# Patient Record
Sex: Female | Born: 1980 | Race: White | Hispanic: No | Marital: Single
Health system: Midwestern US, Academic
[De-identification: ages and names within clinical notes are randomized; demographics above are authoritative.]

---

## 2019-09-20 ENCOUNTER — Encounter: Admit: 2019-09-20 | Discharge: 2019-09-20

## 2020-02-16 IMAGING — US ABDLM
1 series · 14 of 25 positions shown · non-contrast
Comparison: none

[Series 1: us abdomen limited · 14 of 38 slices shown]
[im 1/38]
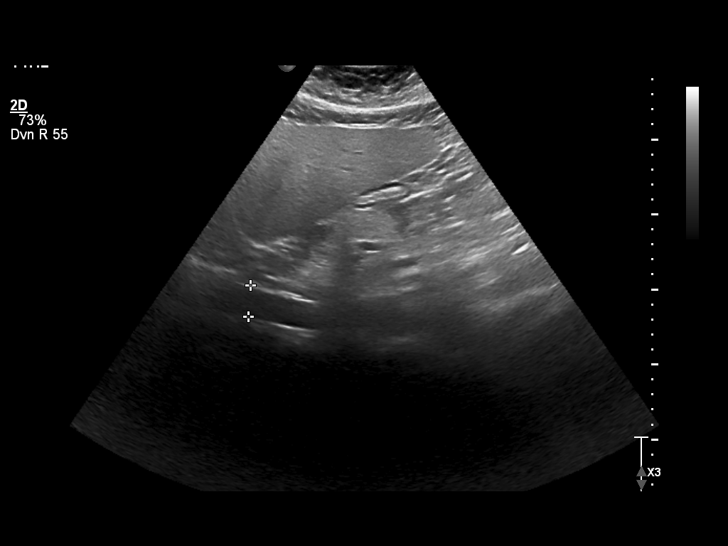
[im 4/38]
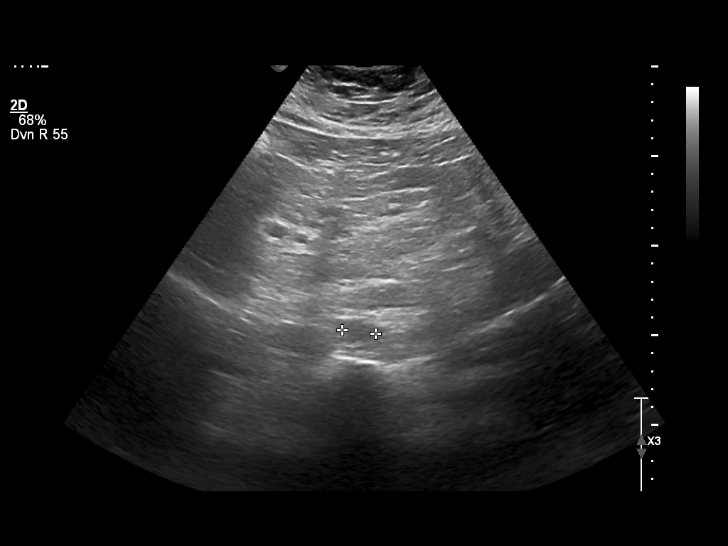
[im 7/38]
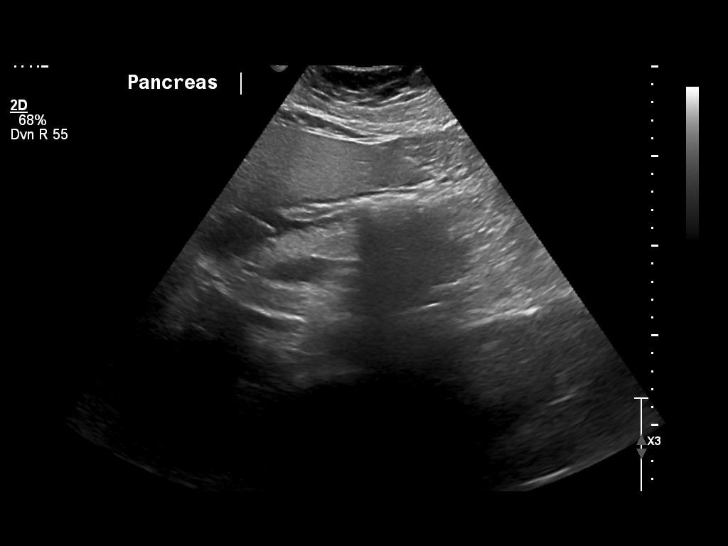
[im 10/38]
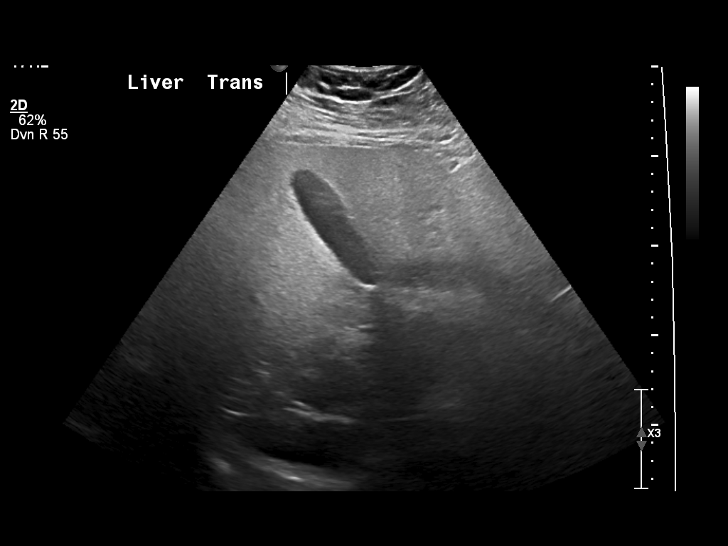
[im 13/38]
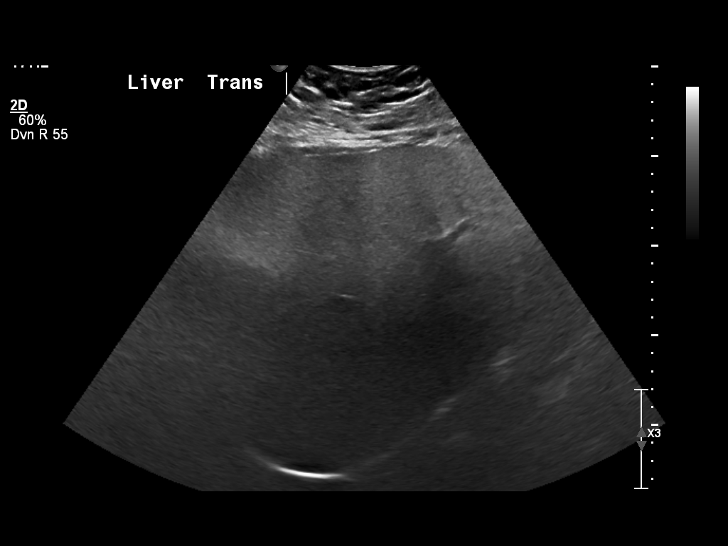
[im 14/38]
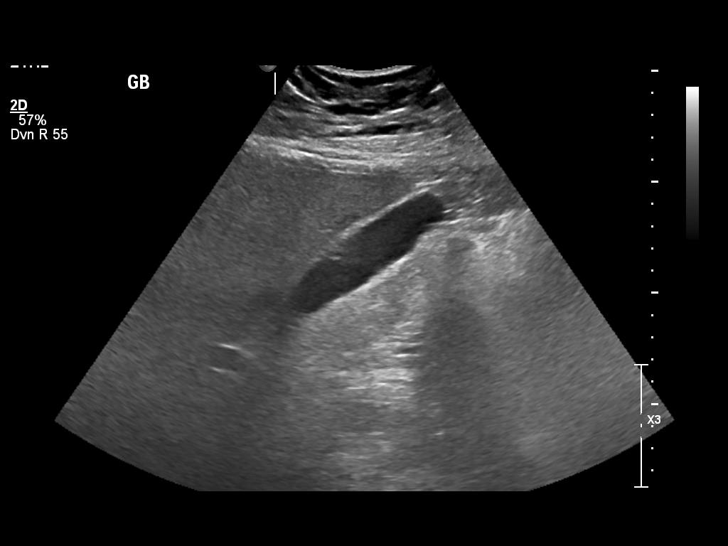
[im 17/38]
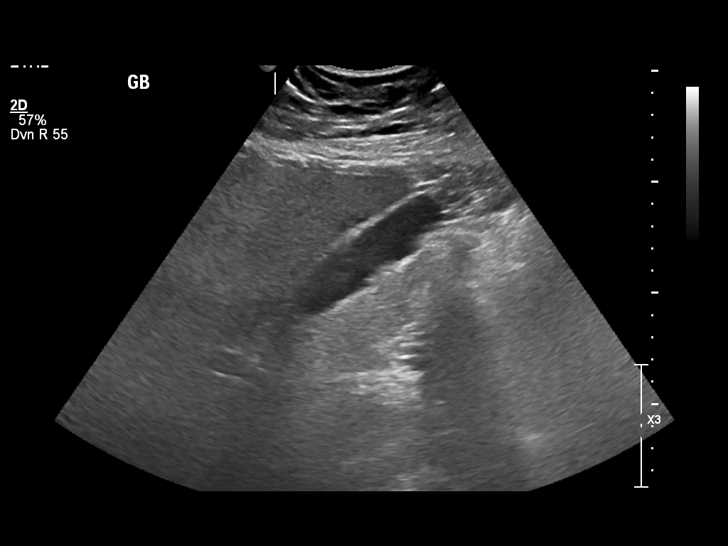
[im 21/38]
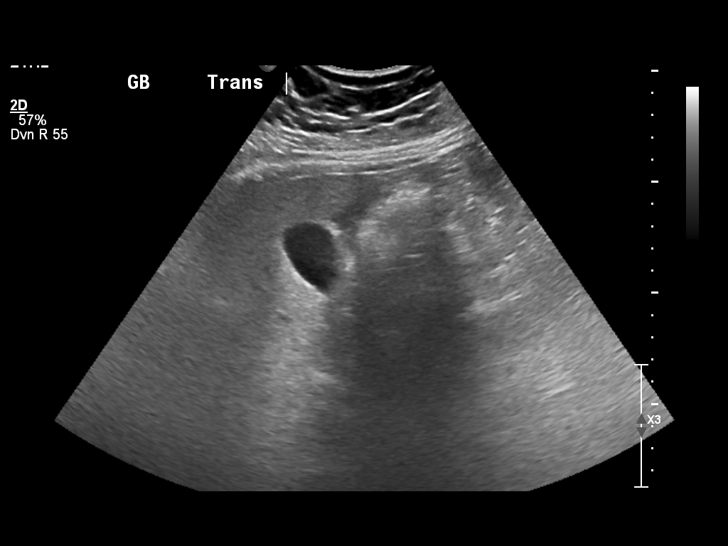
[im 24/38]
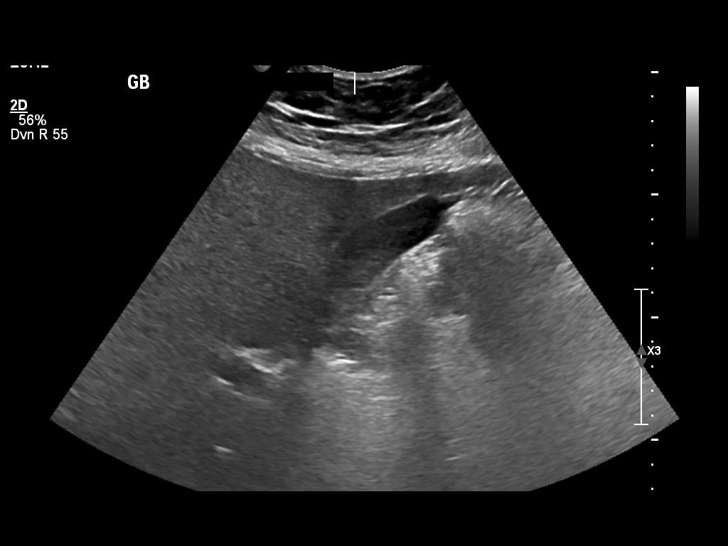
[im 25/38]
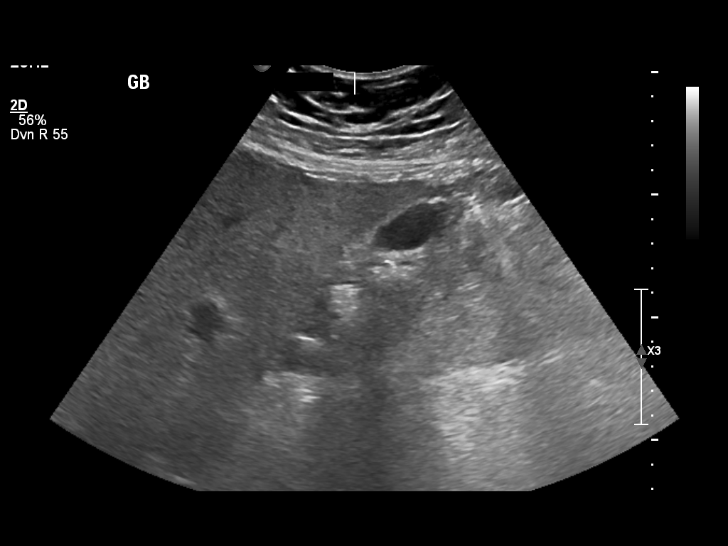
[im 28/38]
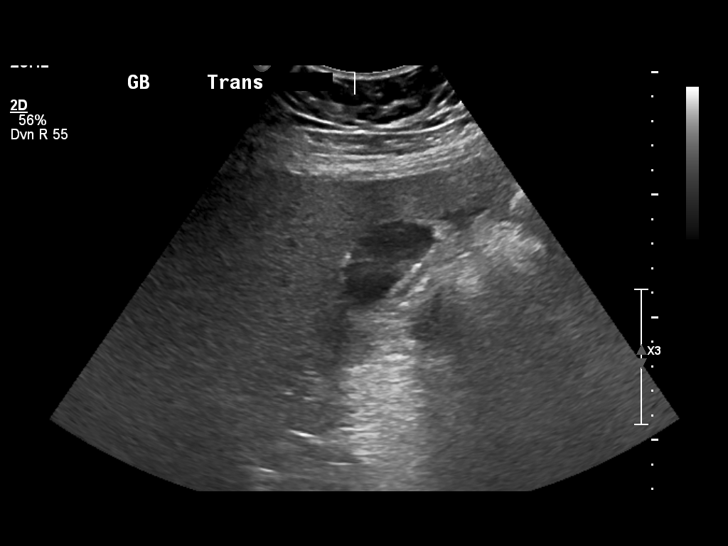
[im 31/38]
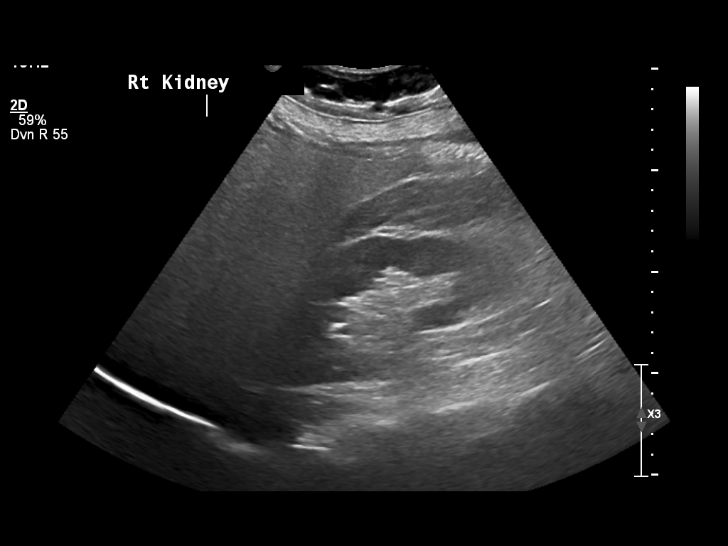
[im 34/38]
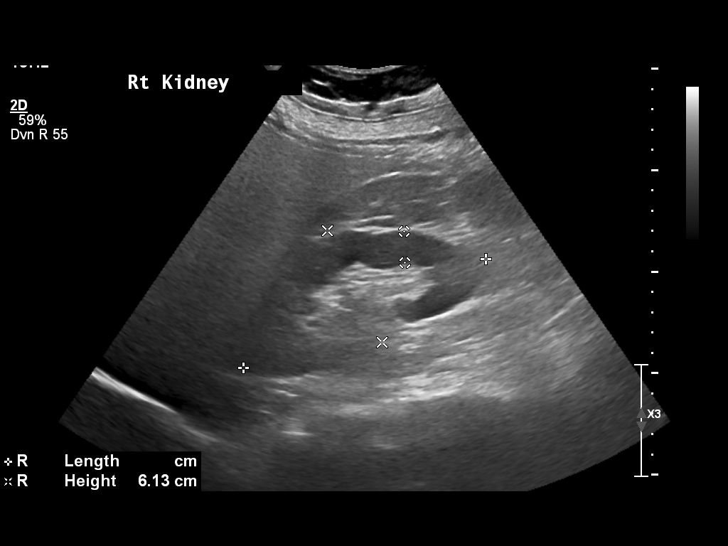
[im 38/38]
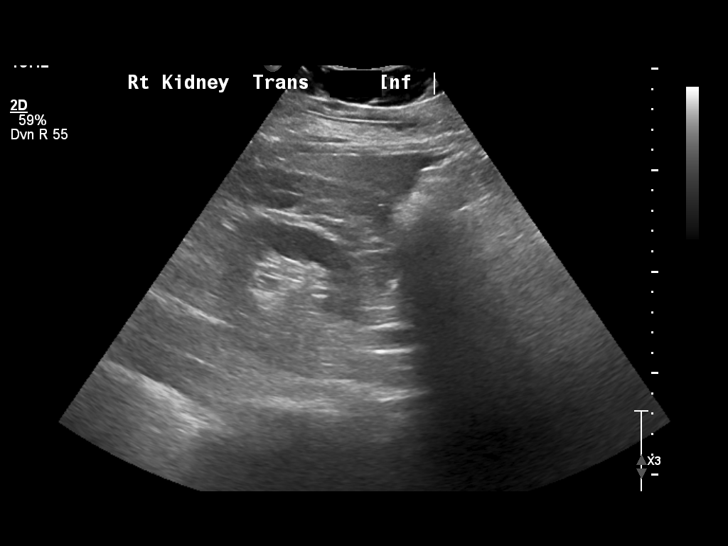

[14 of 25 positions shown; findings below may reference images not displayed]

EXAM

Ultrasound abdomen limited right upper quadrant

INDICATION

Postprandial nausea

FINDINGS

Ultrasound of the right upper abdominal quadrant demonstrates a normal thin walled gallbladder with
no gallstones and no pericholecystic fluid. The common bile duct diameter is 4.7 millimeters.

The liver is enlarged, measuring 21.1 cm in length. There is mildly increased echogenicity with no
focal abnormality.

The head and portion of the body of the pancreas appear normal. A portion of the pancreatic body
and tail are obscured by bowel gas.

The right kidney measures 13.1 x 6.1 x 7.8 cm in diameter. There is no hydronephrosis. There is no
mass lesion or shadowing calculus.

The abdominal aorta is not enlarged.

IMPRESSION

There are no gallstones in there is no bile duct obstruction. There is mild hepatomegaly and fatty
infiltration of the liver.

Tech Notes:

## 2020-03-09 IMAGING — MR C-spine^Routine
5 series · 43 of 48 positions shown · non-contrast
Comparison: none

[Series 2: T2 · sagittal · 3.0mm · 0.49mm/px · 6 of 15 slices shown]
[im 1/15]
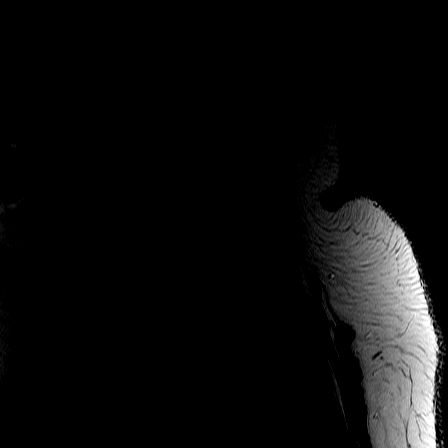
[im 3/15]
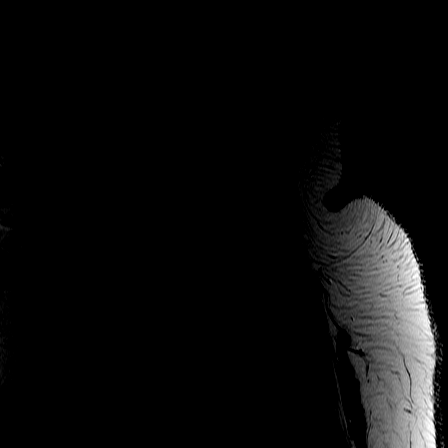
[im 6/15]
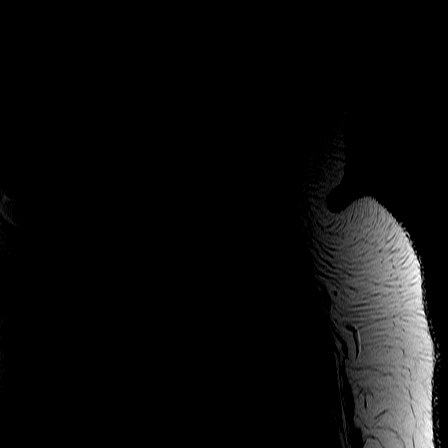
[im 9/15]
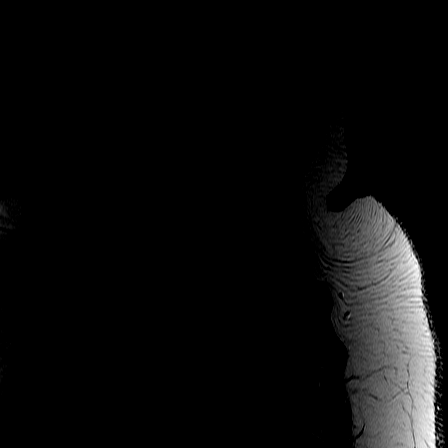
[im 12/15]
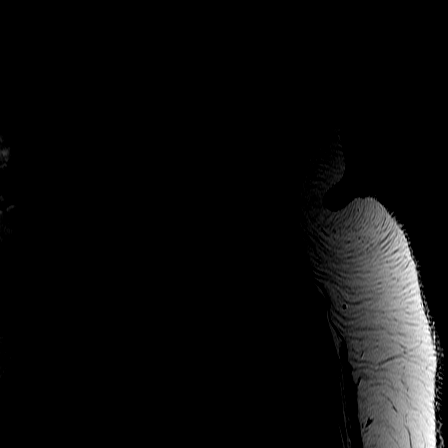
[im 15/15]
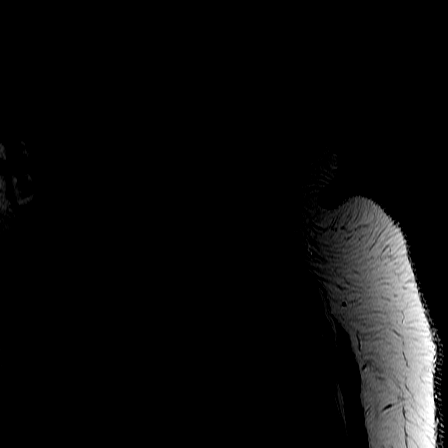

[Series 3: T1 · sagittal · 3.0mm · 0.69mm/px · 7 of 15 slices shown (1 of 2)]
[im 1/15]
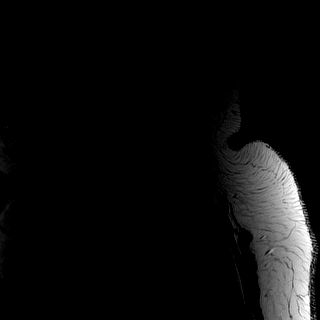
[im 3/15]
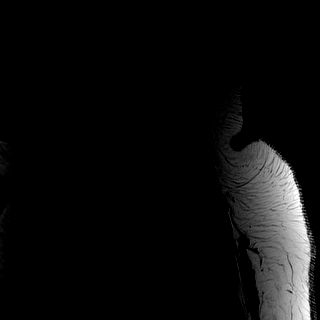
[im 5/15]
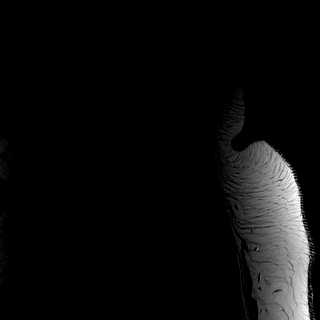
[im 8/15]
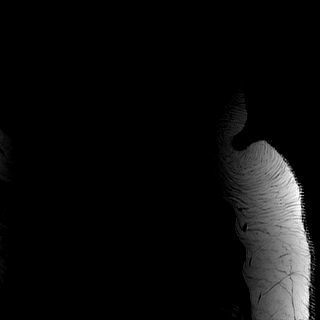
[im 10/15]
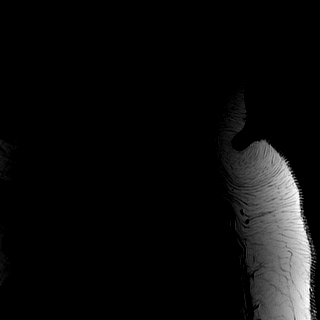
[im 12/15]
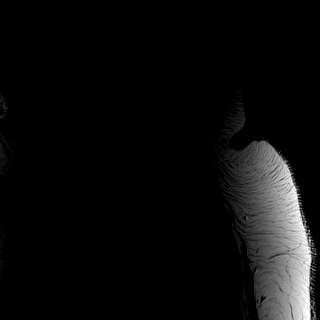
[im 15/15]
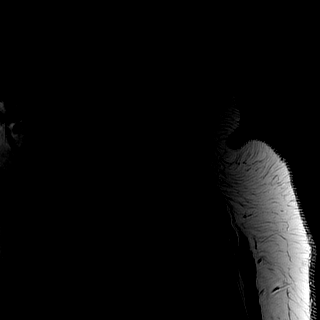

[Series 4: STIR · sagittal · 3.0mm · 0.86mm/px · 7 of 15 slices shown]
[im 1/15]
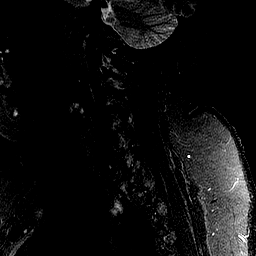
[im 3/15]
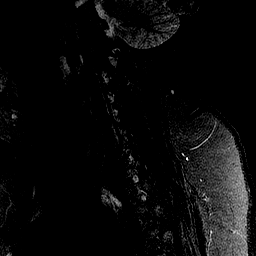
[im 5/15]
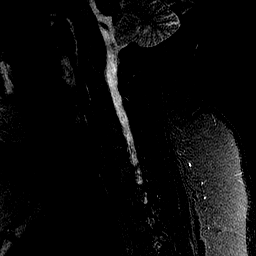
[im 8/15]
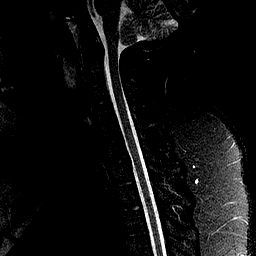
[im 10/15]
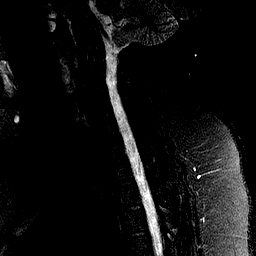
[im 12/15]
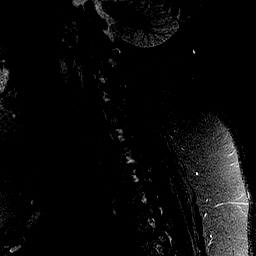
[im 15/15]
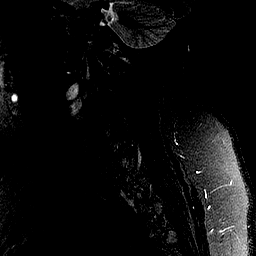

[Series 5: provider echo axial · axial · 3.0mm · 0.39mm/px · z∈[-41,+73]mm · 9 of 30 slices shown]
[im 1/30]
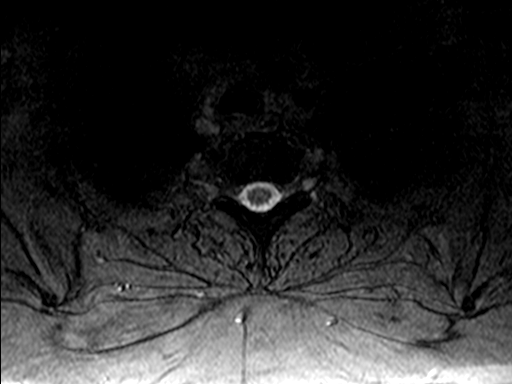
[im 3/30]
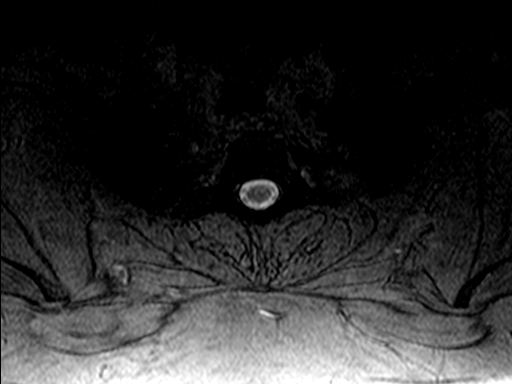
[im 5/30]
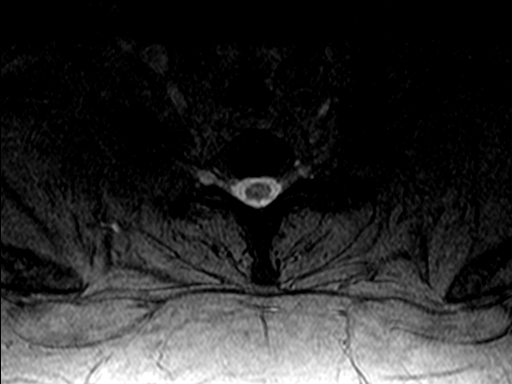
[im 9/30]
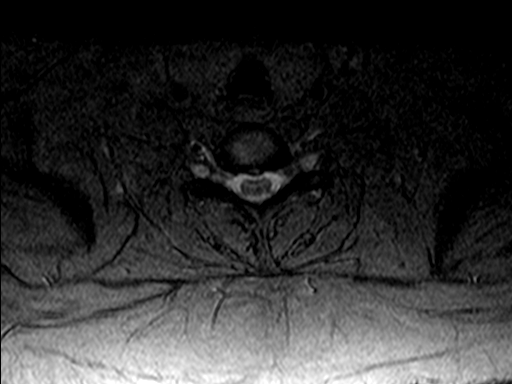
[im 14/30]
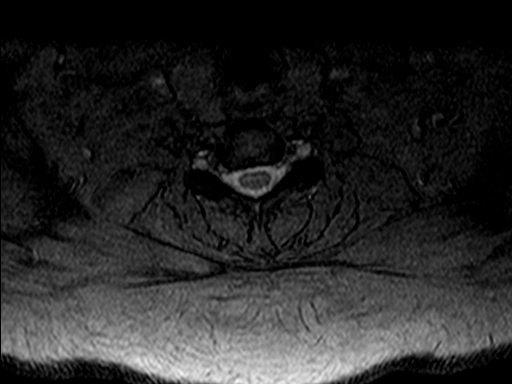
[im 16/30]
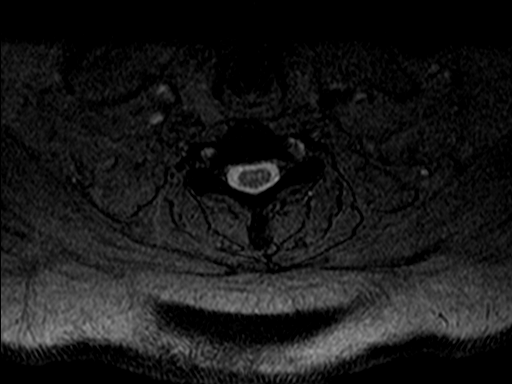
[im 21/30]
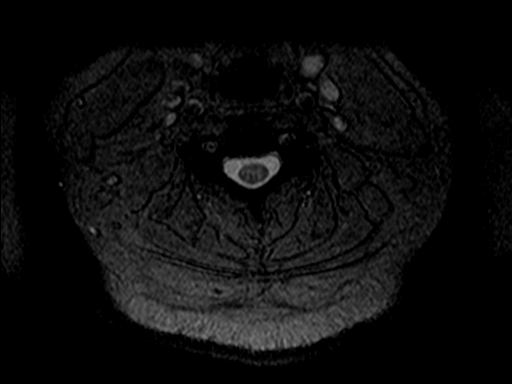
[im 25/30]
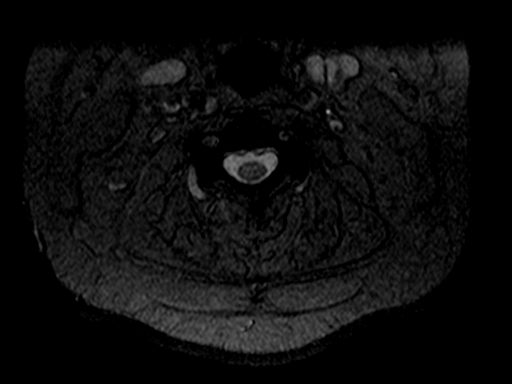
[im 30/30]
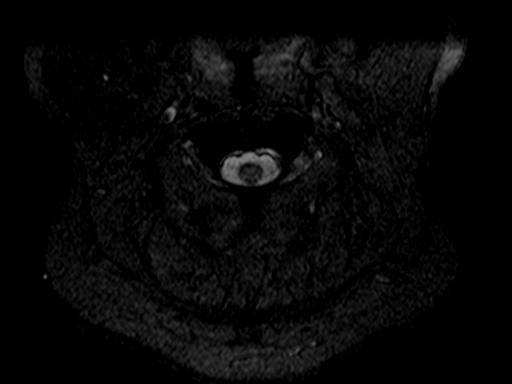

[Series 6: T1 · axial · 3.0mm · 0.78mm/px · z∈[-41,+73]mm · 14 of 30 slices shown (2 of 2)]
[im 1/30]
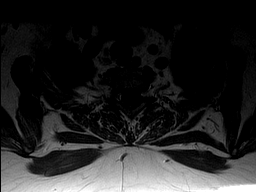
[im 3/30]
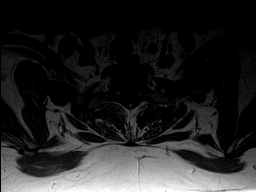
[im 5/30]
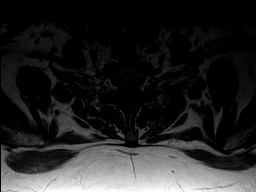
[im 7/30]
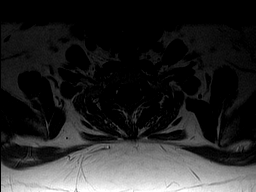
[im 9/30]
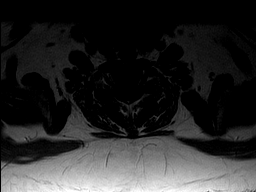
[im 12/30]
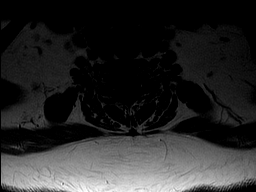
[im 14/30]
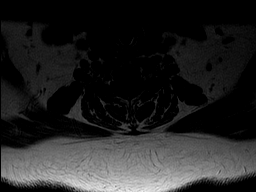
[im 16/30]
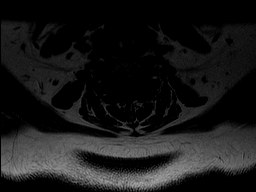
[im 18/30]
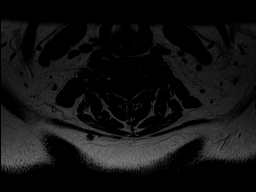
[im 21/30]
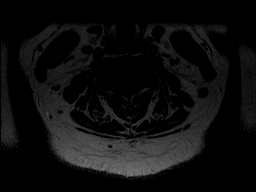
[im 23/30]
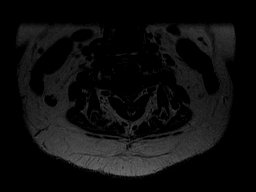
[im 25/30]
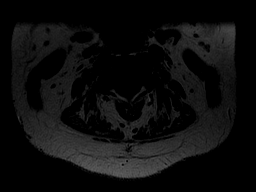
[im 27/30]
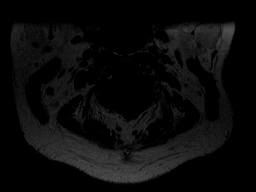
[im 30/30]
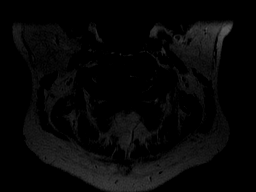

[43 of 48 positions shown; findings below may reference images not displayed]

EXAM

MR cervical spine wo con

INDICATION

Postprandial nausea, left arm/hand pain, numbness

TECHNIQUE

Multiplanar, multisequence imaging of the cervical spine without contrast.

COMPARISONS

None available at the time of dictation.

FINDINGS

ANATOMY: Straightening of the normal cervical lordosis. No spondylolisthesis.

VERTEBRAL BODIES: No endplate compression fracture. No significant endplate degenerative change.

SPINAL CANAL: No significant spinal canal narrowing.

INTERVERTEBRAL DISCS/UNCOVERTEBRAL JOINTS: Diffuse disc dessication. No significant disc
herniation.

FACETS: No significant facet arthropathy.

C2-C3: Unremarkable.

C3-C4: Unremarkable.

C4-C5: Unremarkable

C5-C6: Unremarkable.

C6-C7: Unremarkable.

C7-T1: Unremarkable.

OTHER: Possible reactive cervical chain lymph nodes in the submandibular station, incompletely
characterized (series 5, image 6).

IMPRESSION
1. Straightening of the normal cervical lordosis, which may be secondary to patient positioning
and/or muscular spasm.
2. Normal cervical cord signal. No foraminal narrowing.

Tech Notes:

left arm/hand pain and numbness, no known recent injury, cervical radiculopathy

## 2020-03-09 IMAGING — CT ABDOMEN_PELVIS W(Adult)
2 of 3 series · 12 of 46 positions shown, 14 images · non-contrast
Comparison: none

[Series 2: abdomen_pelvis ax 3.00 br40 s3 · axial · 0.74mm/px · z∈[+1302,+1746]mm · 9 of 172 slices shown, 11 images]
[im 12/172  soft-tissue]
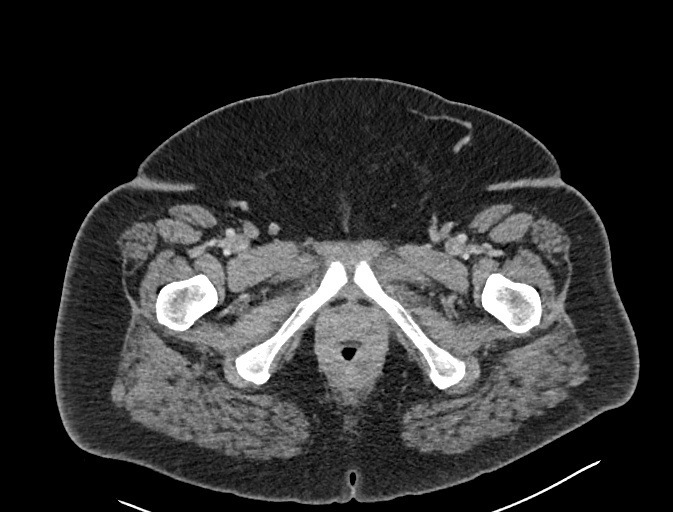
[im 12/172  bone]
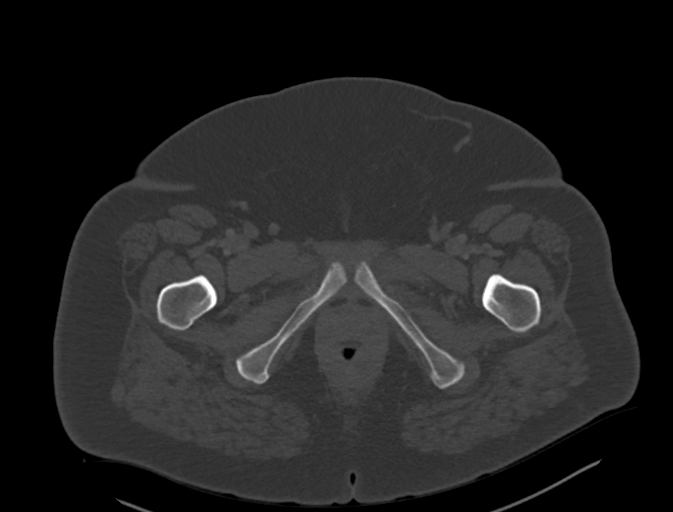
[im 34/172  soft-tissue]
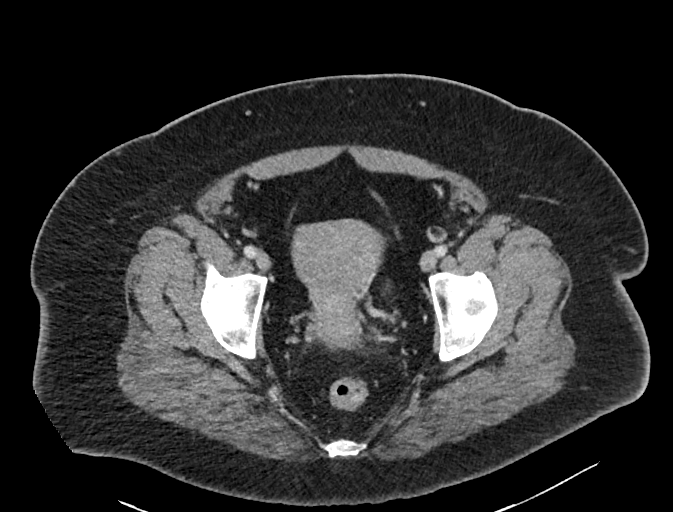
[im 50/172  soft-tissue]
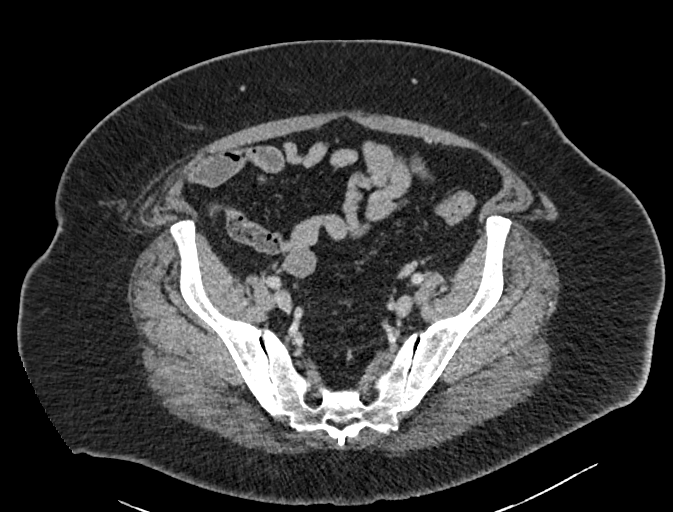
[im 67/172  soft-tissue]
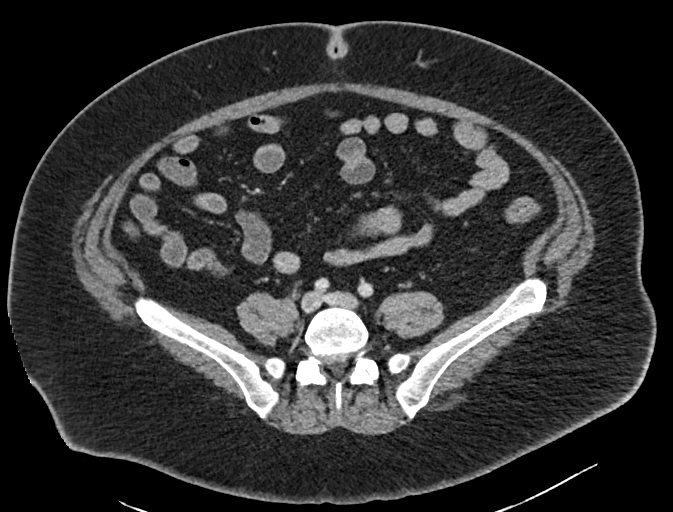
[im 89/172  soft-tissue]
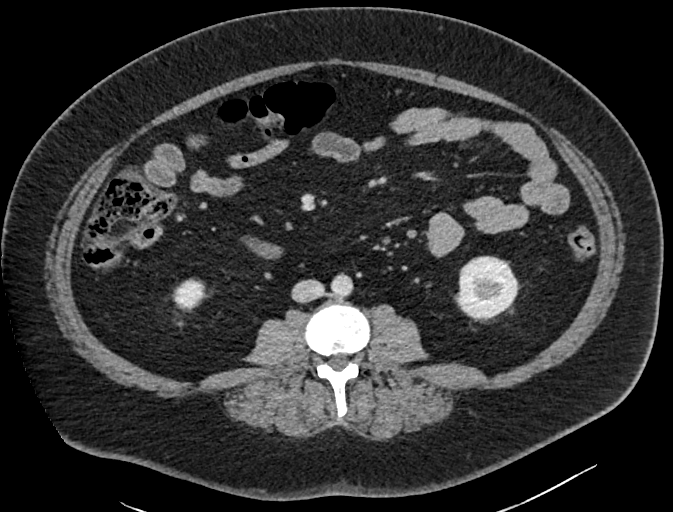
[im 105/172  soft-tissue]
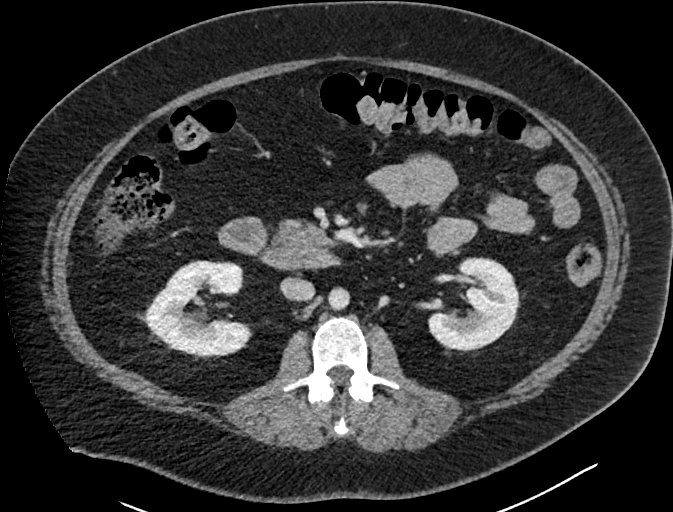
[im 122/172  soft-tissue]
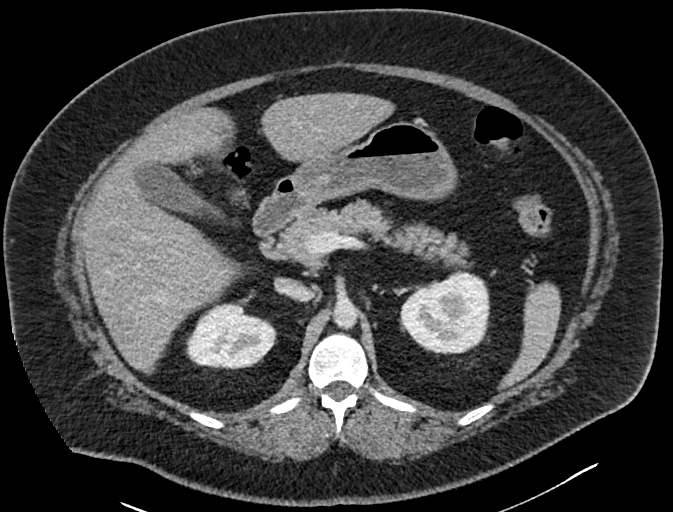
[im 144/172  soft-tissue]
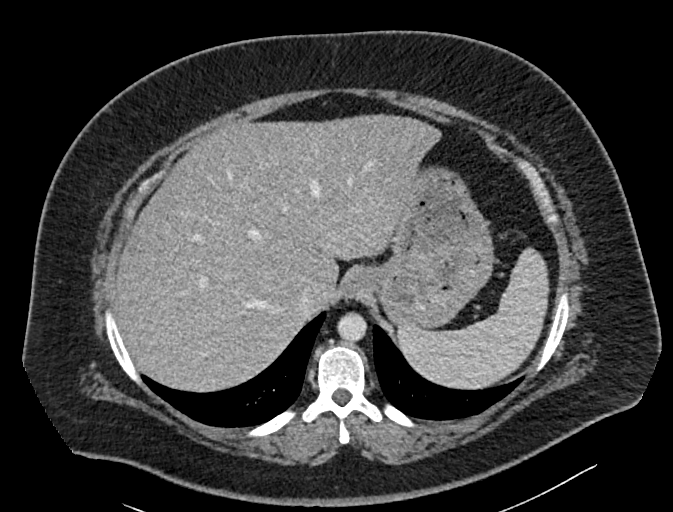
[im 160/172  soft-tissue]
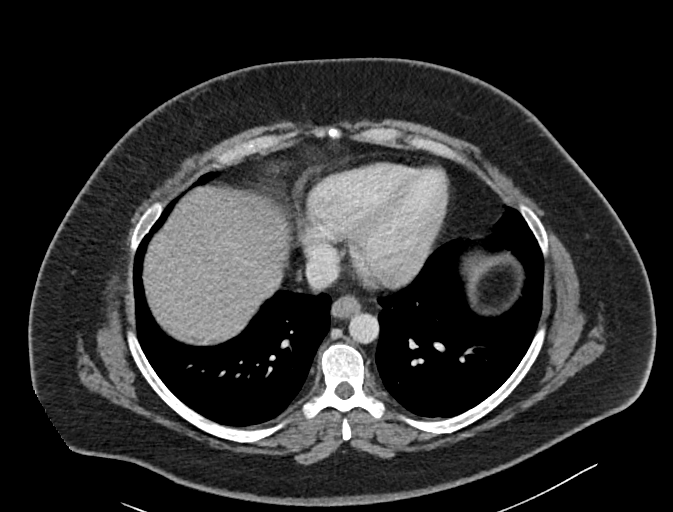
[im 160/172  bone]
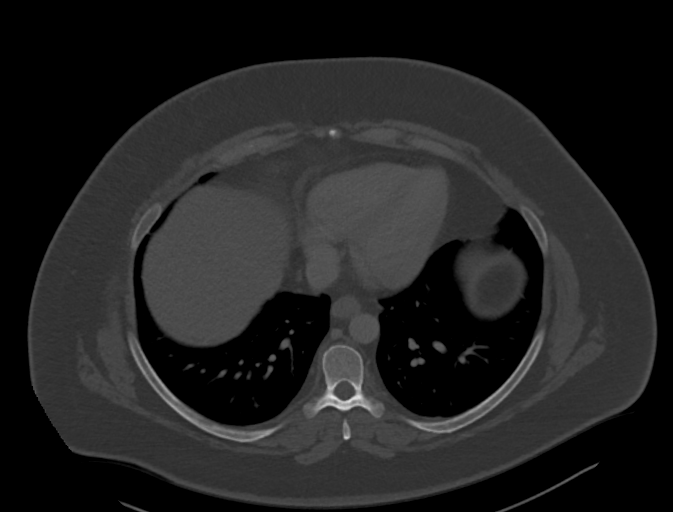

[Series 4: abdomen_pelvis cor 3.00 br40 s3 · coronal · 0.97mm/px · 3 of 125 slices shown]
[im 42/125  soft-tissue]
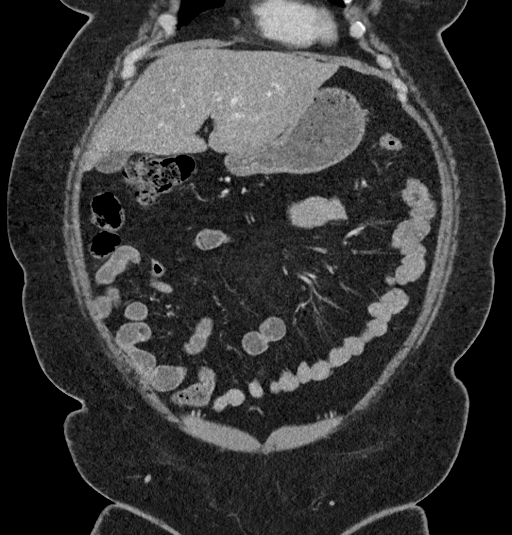
[im 56/125  soft-tissue]
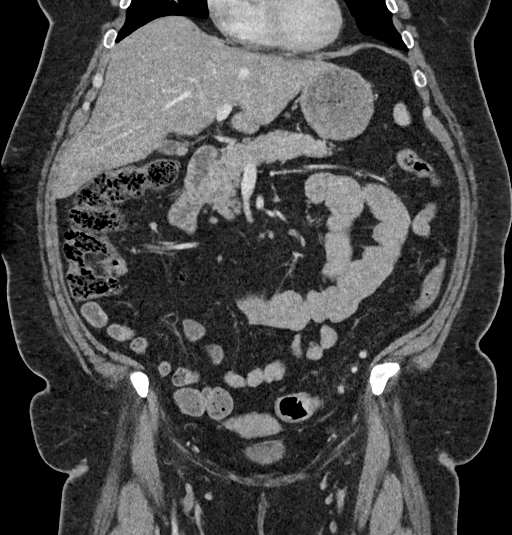
[im 69/125  soft-tissue]
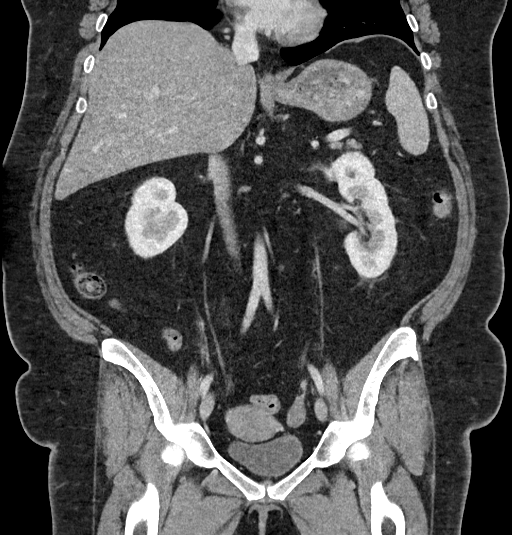

[12 of 46 positions shown; findings below may reference images not displayed]

EXAM

CT abd/pel w iv con

INDICATION

Hypo heel nausea

TECHNIQUE

CT of the abdomen and pelvis was performed. All CT scans at this facility use dose modulation,
iterative reconstruction, and/or weight based dosing when appropriate to reduce radiation dose to as
low as reasonably achieved. 100 milliliters of Omnipaque 300 was administered without adverse effect

# of CT scans in the past year: 0 # of Myocardial perfusion scans this past year: 0

COMPARISONS

Ultrasound of the abdomen 02/16/2020

FINDINGS

Lung bases: No pleural effusion or suspicious pulmonary nodule in the lung bases. Normal cardiac
size without a pericardial effusion.

Liver: Normal hepatic size without a suspicious focal lesion.

Gallbladder and Biliary Tree: Trace hyperdensity at the gallbladder neck extending into the cystic
duct, which may represent a stone versus biliary/(axial 50). No intrahepatic or extrahepatic biliary
dilation.

Spleen: Unremarkable

Pancreas: Unremarkable

Adrenal Glands: Unremarkable

Kidneys: Symmetric contrast enhancement without evidence of a suspicious focal lesion. [Small
likely benign cysts within the left kidney.]

Bladder: Unremarkable for the degree of distention.

Pelvic Organs: Unremarkable

Bowel: The stomach is normal. There is normal caliber of the small and large bowel without evidence
of bowel obstruction. A normal appendix is visualized in the right lower quadrant. Mild sigmoid
diverticulosis without CT evidence of acute diverticulitis.

Ascites: Absent

Lymphadenopathy: No pathologically enlarged or morphologically abnormal lymph nodes by CT
appearance.

Vasculature: There is normal opacification of the visualized abdominal/pelvic vasculature without
evidence of stenosis or aneurysmal dilation.

Abdominal Wall and Mesentery: Unremarkable

Musculoskeletal: Degenerative changes of the visualized spine without evidence of an aggressive
osseous lesion or fracture. Posterior disc osteophyte complex at L4-5.

IMPRESSION
1. No CT evidence of an acute abdominal or pelvic process.
2. Biliary sludge versus cholelithiasis at the gallbladder neck. No CT evidence of acute
cholecystitis.
3. No bowel obstruction. Mild sigmoid diverticulosis without CT evidence of acute diverticulitis.

Tech Notes:

C/O NAUSEA AFTER MEALS. BLOATING. DM2. H/O APPY, SX FOR UTERINE SEPTUM. CONSENT OBTAINED, PT GIVEN
100 CC [id number] BY IV.
HB

## 2020-04-03 IMAGING — CR SHOULDCMRT
2 series · 2 of 2 positions shown · non-contrast
Comparison: none

[shoulder internal]
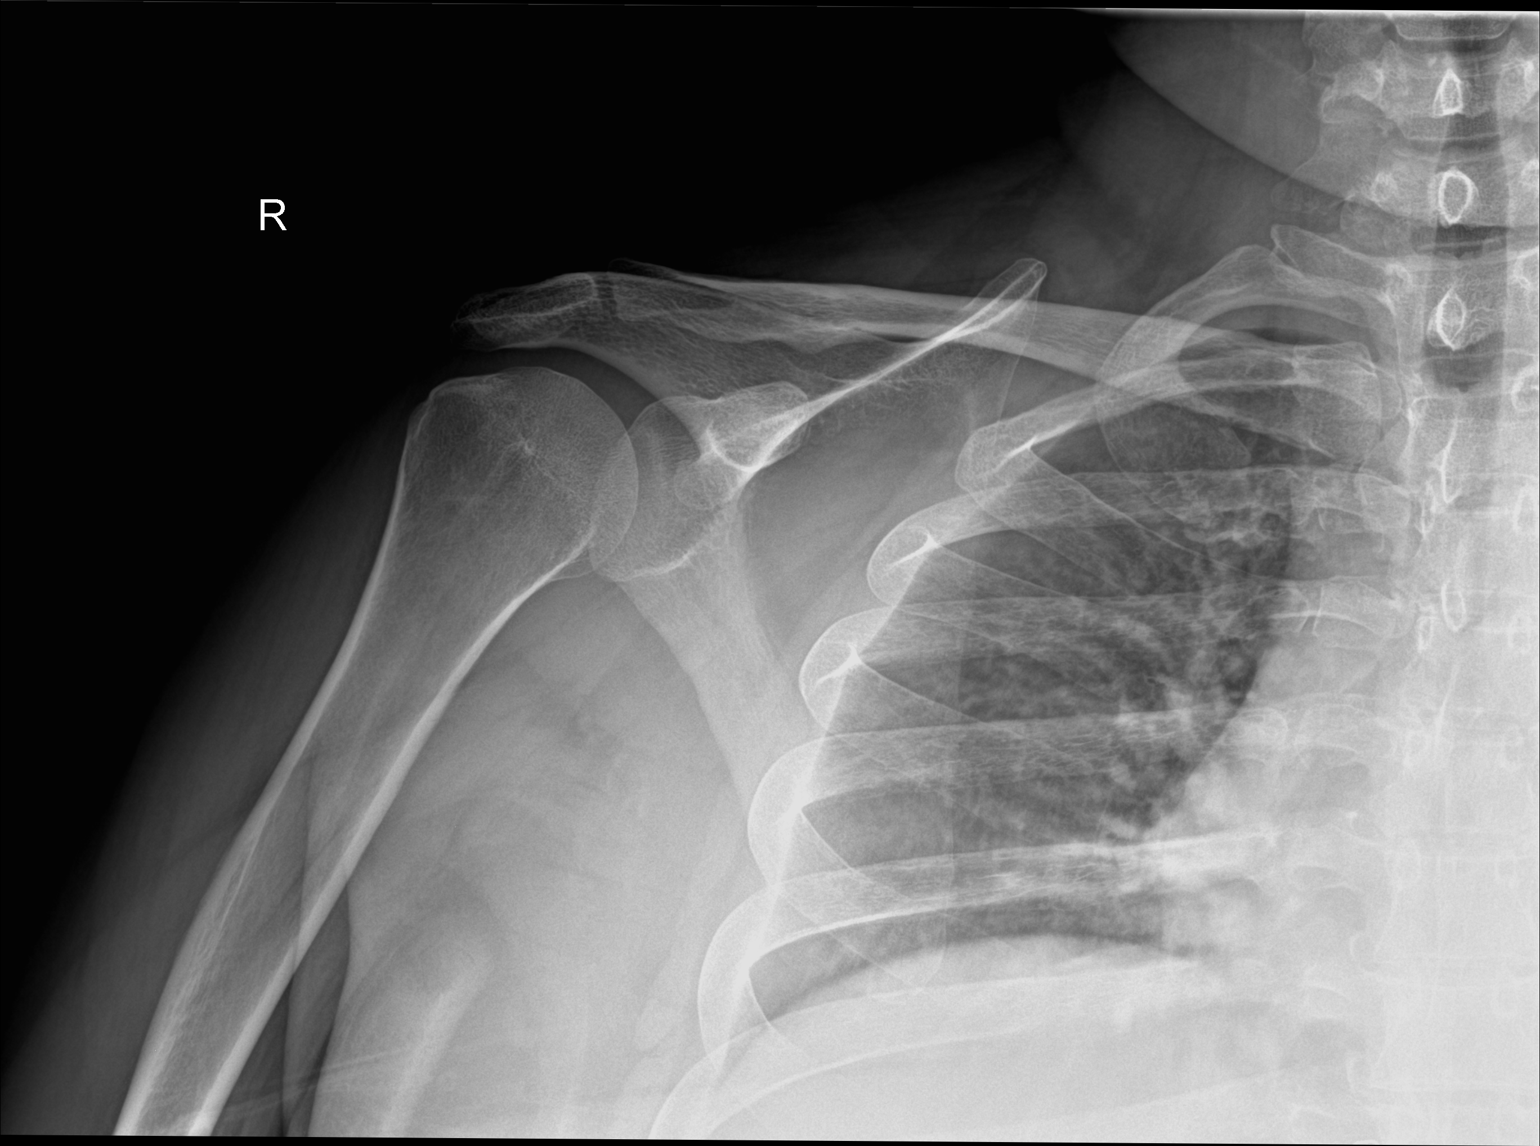

[shoulder y-view]
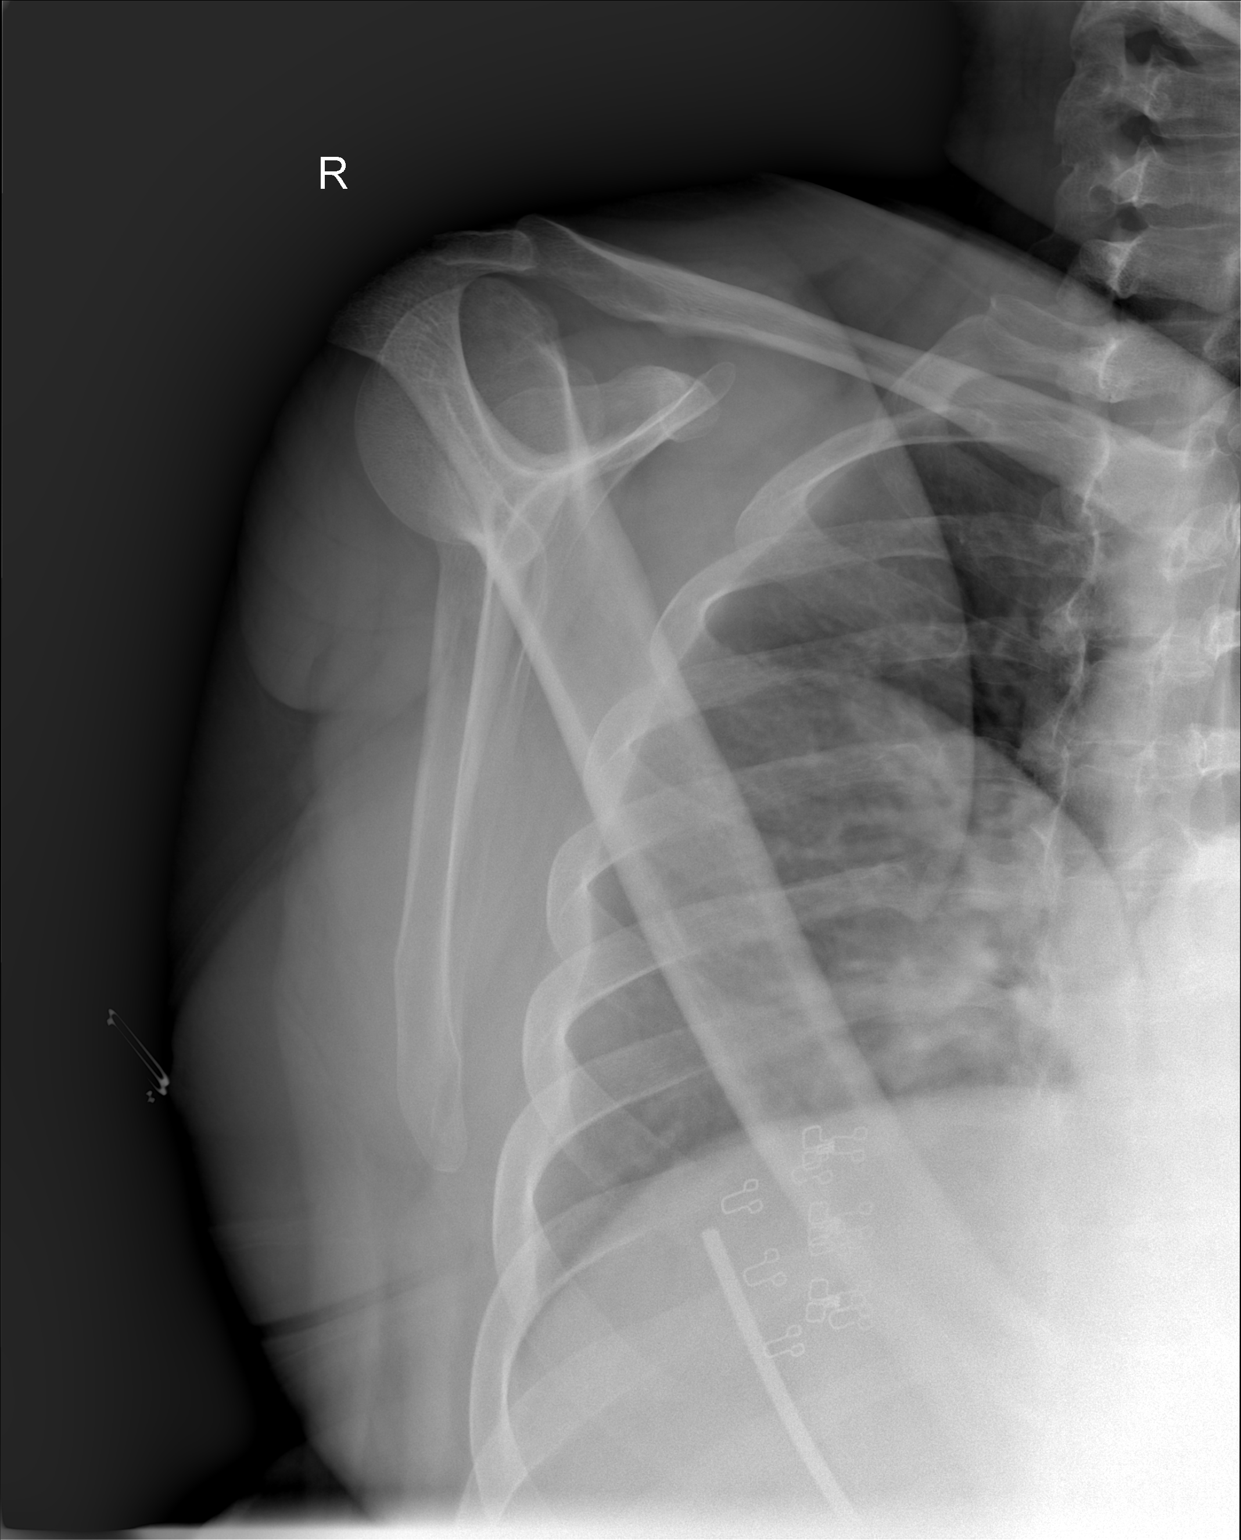

[2 of 2 positions shown; findings below may reference images not displayed]

DIAGNOSTIC STUDIES

EXAM

XR shoulder right, complete

INDICATION

shoulder pain
Pt c/o right shoulder pain since January 2020. No known injury. Pt denies pregnancy. CF

TECHNIQUE

Internal and external rotated views lateral views and axillary views of the right shoulder.

COMPARISONS

None available

FINDINGS

No fractures or dislocations are seen. Joint spaces are well maintained.

IMPRESSION

Negative right shoulder.

Tech Notes:

Pt c/o right shoulder pain since January 2020. No known injury. Pt denies pregnancy. CF

## 2020-04-03 IMAGING — CR HIPCMLT
2 series · 2 of 2 positions shown · non-contrast
Comparison: none

[hip ap pelvis]
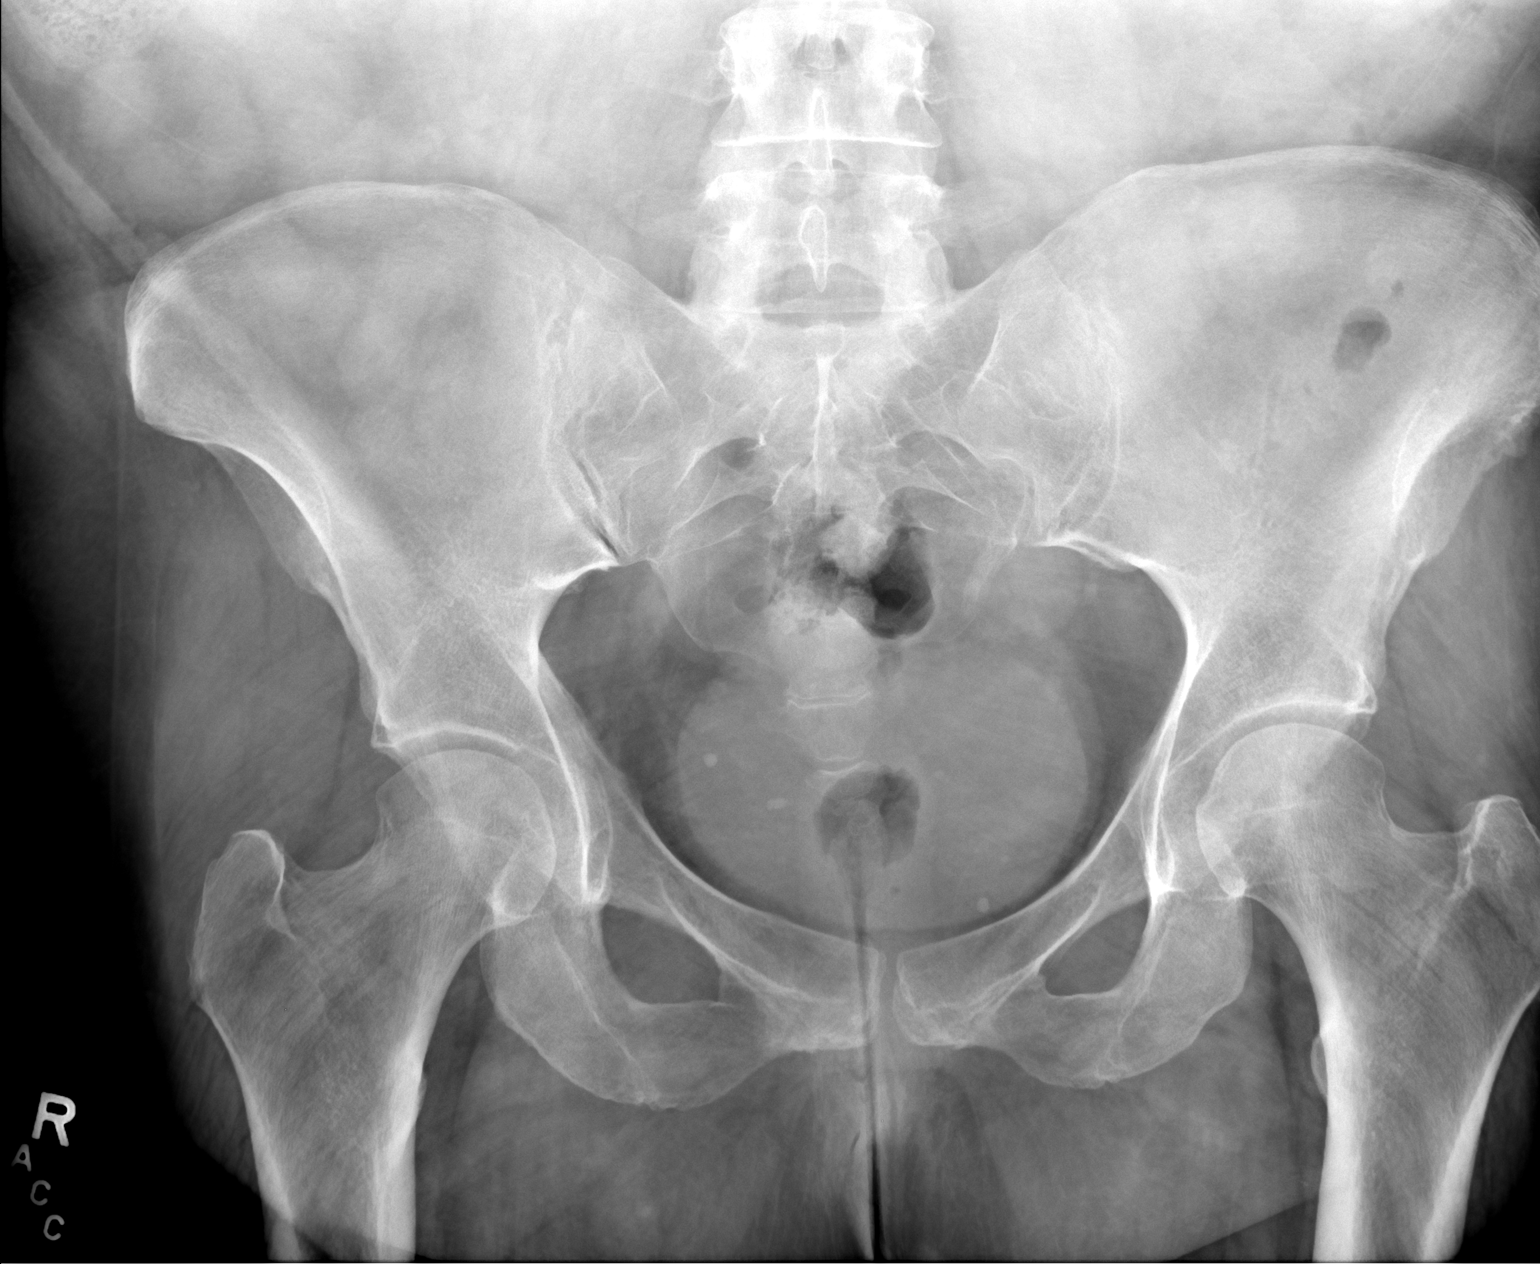

[hip frog lat]
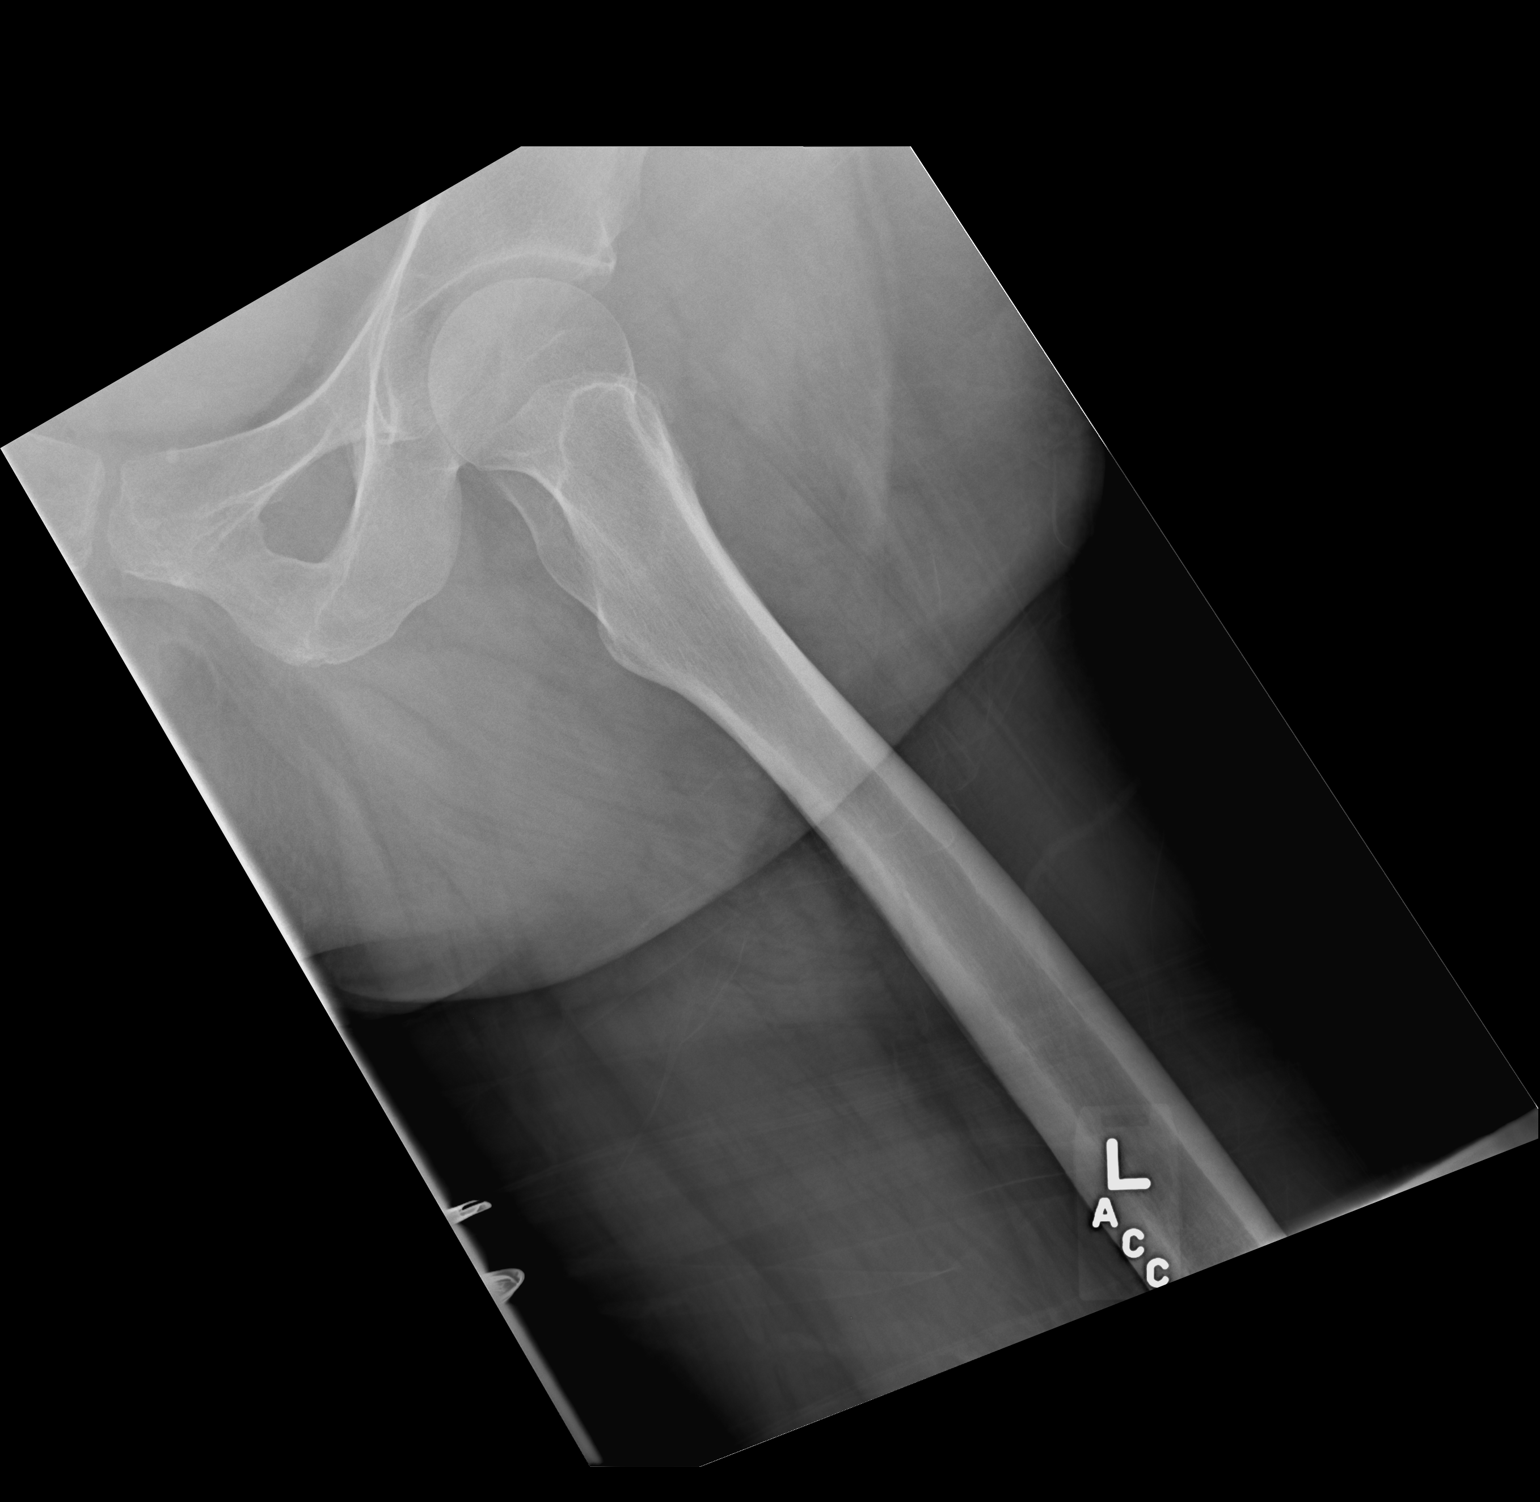

[2 of 2 positions shown; findings below may reference images not displayed]

DIAGNOSTIC STUDIES

EXAM

XR hip LT, 2-3V w or wo pelvis

INDICATION

hip pain
Pt c/o left hip pain. No known injury. Pt denies pregnancy. AC/AB/CF

TECHNIQUE

AP pelvis AP and lateral views left hip

COMPARISONS

None available

FINDINGS

No fractures or dislocations are seen. Joint spaces are well maintained.

IMPRESSION

Negative left hip.

Tech Notes:

Pt c/o left hip pain. No known injury. Pt denies pregnancy. AC/AB/CF

## 2020-04-11 IMAGING — MR Hips^ARTHROGRAM
1 series · 1 of 1 positions shown · non-contrast
Comparison: none

[Series 11: T1 · axial · 4.0mm · 0.70mm/px · 1 of 1 slices shown]
[im 1/1]
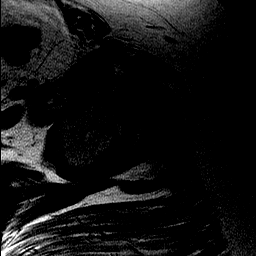

[1 of 1 positions shown; findings below may reference images not displayed]

DIAGNOSTIC STUDIES

EXAM

Left hip MR arthrogram.

INDICATION

hip pain
LEFT HIP PAIN S/P INJURY IN 6309, PT STATES IT IS WORSENING AND HURTS TO LAY ON OR CROSS LEG

TECHNIQUE

Left hip MR arthrography protocol according institutional standard after the administration of
intra-articular contrast.

COMPARISONS

Arthrogram from same date.

FINDINGS

Marrow/joint space: There is adequate capsular distention of the left hip with contrast. There is
slight marginal spurring of the hip. The articular cartilage of the hip is intact. No fracture. No
bone edema. No aggressive osseous lesion. The right hip is also unremarkable on this exam.

Labrum: Small sublabral foramen at the posterior superior labrum which is bilaterally symmetric. No
compelling evidence of labral tear.

Soft tissues: The hamstring tendons are intact. No iliopsoas or trochanteric bursitis. The gluteal
tendons are within normal limits. The hip flexor and short adductor musculature is within normal
limits.

The urinary bladder is partially distended. The uterus is within normal limits for patient's age
without evidence of mass, though a punctate area of low STIR signal is noted adjacent to the
endometrium which is most likely reflective of a small calcification. Cervical nabothian cysts are
present. No adnexal mass. Normal appearance of both ovaries. No free pelvic fluid. The urinary
bladder is only partially distended.

IMPRESSION

No compelling evidence of internal derangement. Trace marginal spurring without chondrosis. No
compelling evidence of labral tear

Tech Notes:

LEFT HIP PAIN S/P INJURY IN 6309, PT STATES IT IS WORSENING AND HURTS TO LAY ON OR CROSS LEG

## 2020-05-25 IMAGING — MR Shoulder^ROUTINE
4 of 6 series · 20 of 40 positions shown · non-contrast
Comparison: none

[Series 3: T2 fat-sat · axial · 4.0mm · 0.35mm/px · z∈[+2,+104]mm · 9 of 23 slices shown (1 of 2)]
[im 1/23]
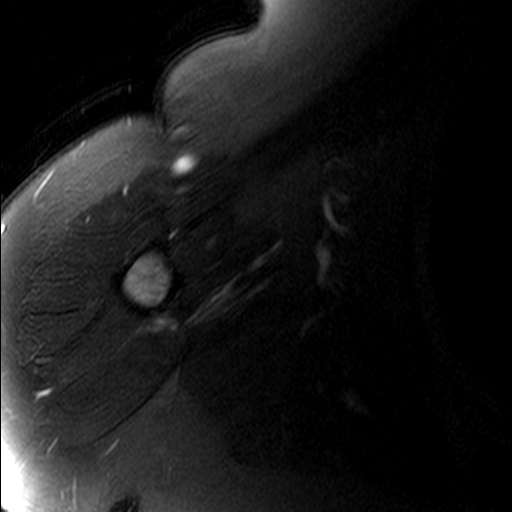
[im 3/23]
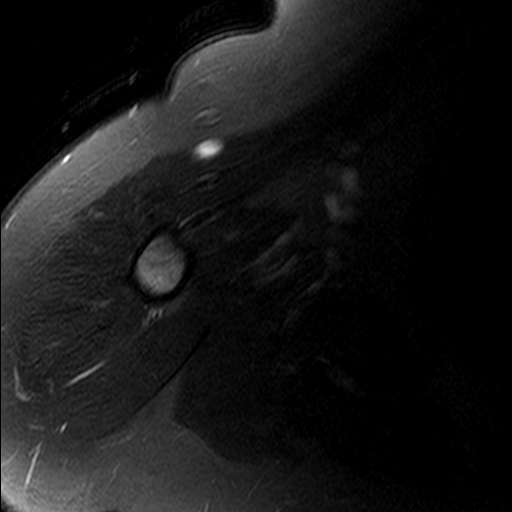
[im 6/23]
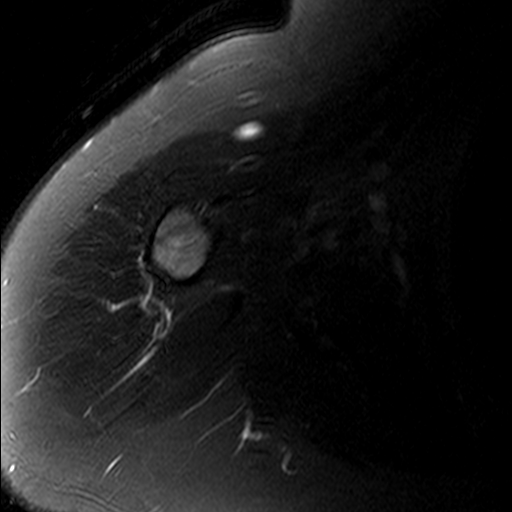
[im 9/23]
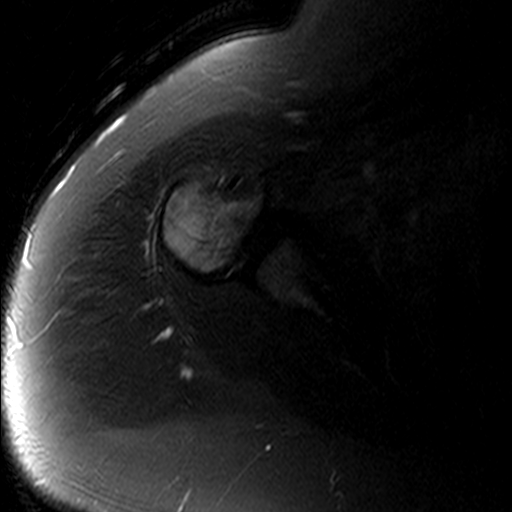
[im 12/23]
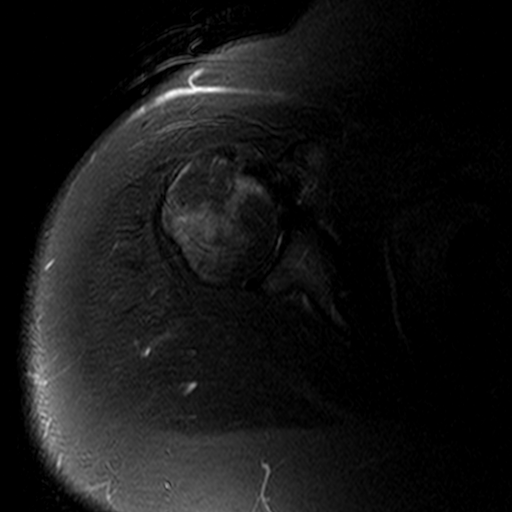
[im 14/23]
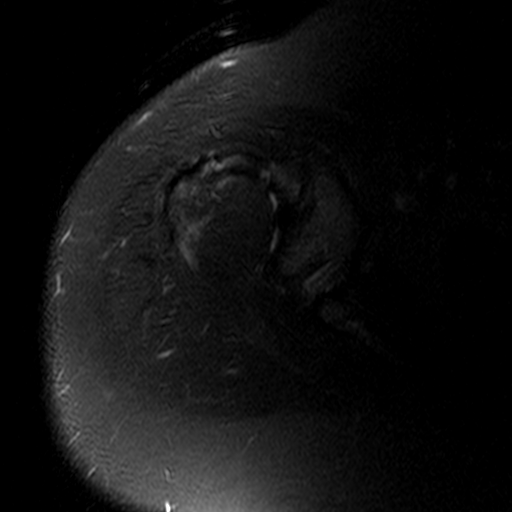
[im 17/23]
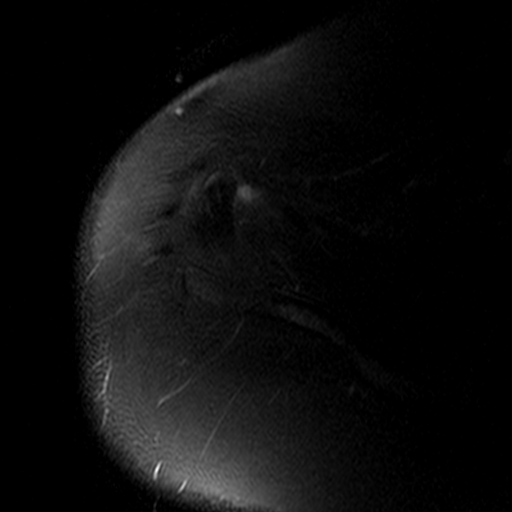
[im 20/23]
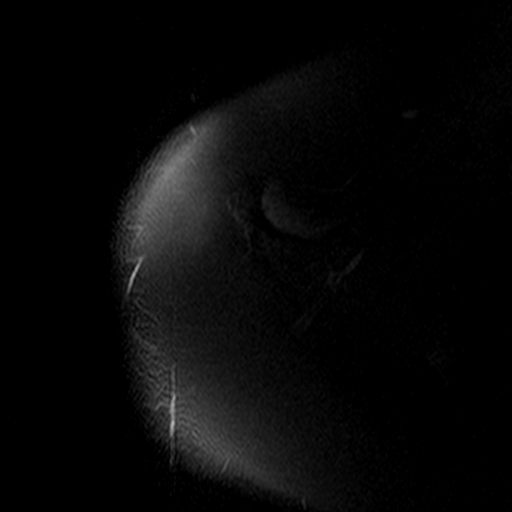
[im 23/23]
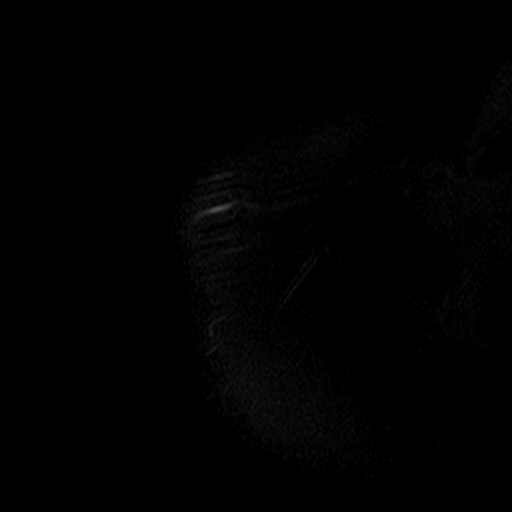

[Series 4: T1 · oblique · 4.0mm · 0.44mm/px · 3 of 22 slices shown]
[im 4/22]
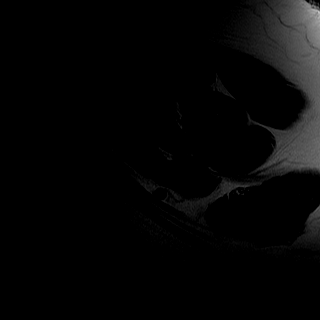
[im 13/22]
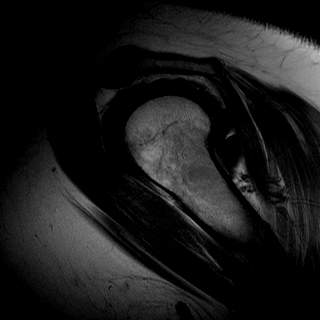
[im 19/22]
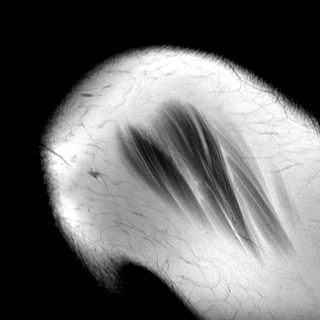

[Series 5: STIR · oblique · 4.0mm · 0.27mm/px · 3 of 22 slices shown]
[im 4/22]
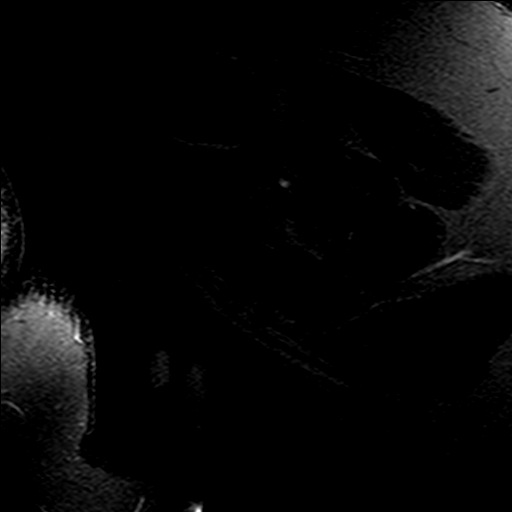
[im 13/22]
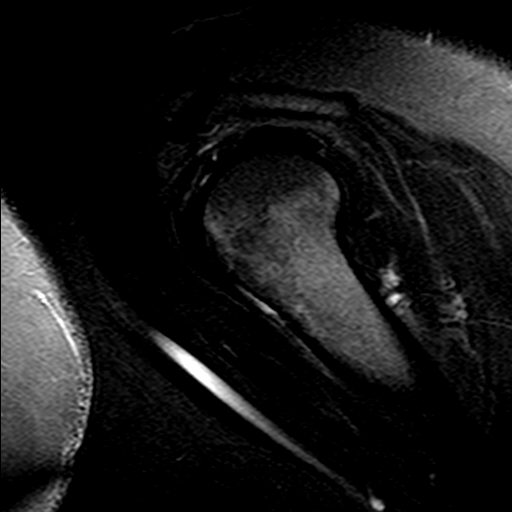
[im 19/22]
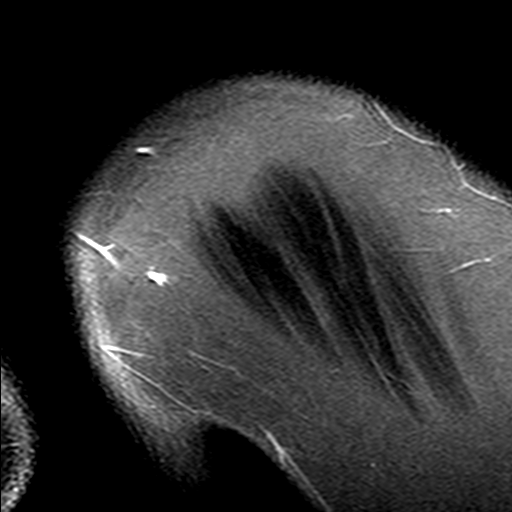

[Series 6: T2 fat-sat · oblique · 4.0mm · 0.27mm/px · 5 of 20 slices shown (2 of 2)]
[im 1/20]
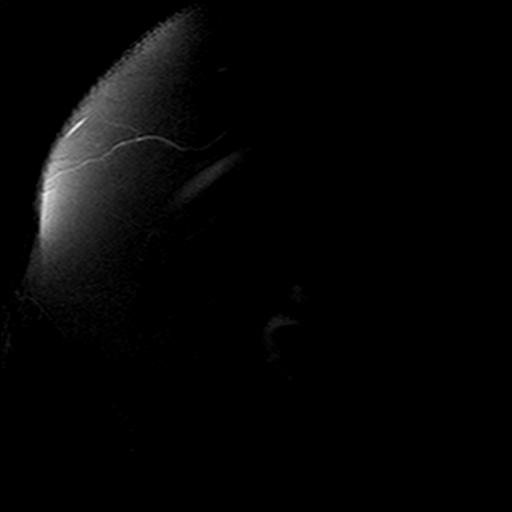
[im 4/20]
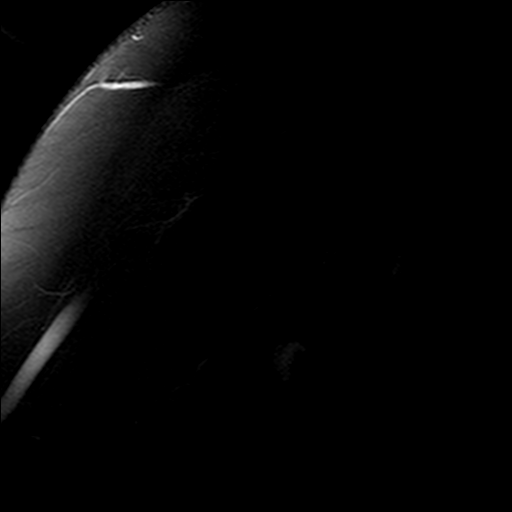
[im 7/20]
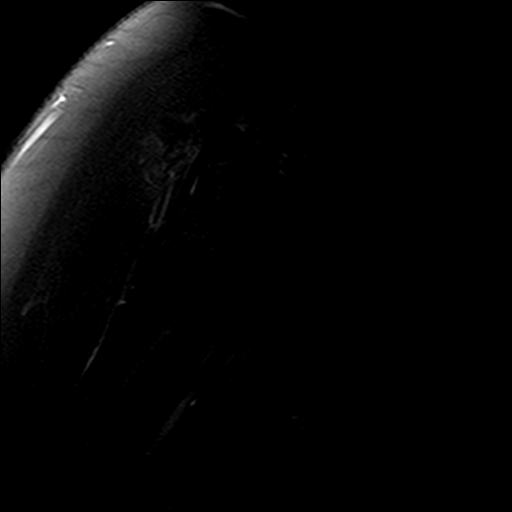
[im 10/20]
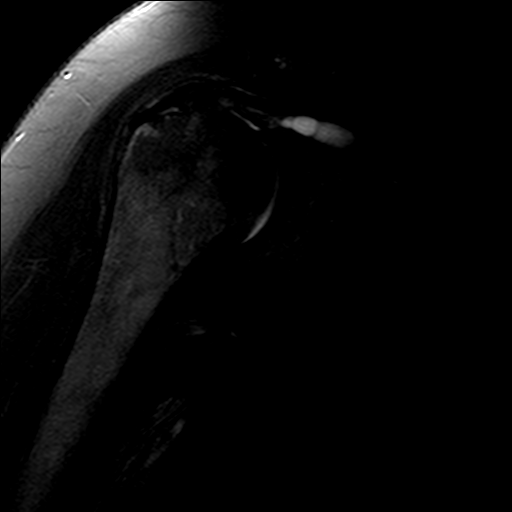
[im 16/20]
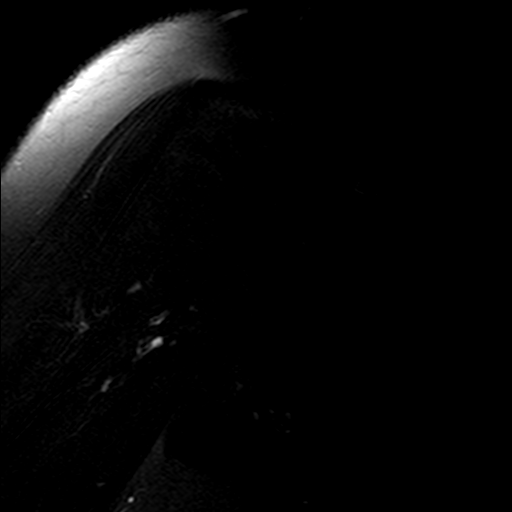

[20 of 40 positions shown; findings below may reference images not displayed]

DIAGNOSTIC STUDIES

EXAM

Right shoulder MRI.

INDICATION

right shoulder pain
right shoulder pain, no known injury

TECHNIQUE

Noncontrast right shoulder MRI protocol.

COMPARISONS

April 03, 2020

FINDINGS

Motion artifact complicates interpretation.

Acromioclavicular joint: There is mild acromioclavicular joint degeneration with undersurface
spurring at the joint space which results in minimal mass effect on the adjacent supraspinatus
myotendinous junction region. No significant subacromial/subdeltoid bursitis. Type 2 acromion. The
coracoacromial ligament is not thickened. The coracoclavicular ligaments are intact.

Rotator cuff: There is tendinopathy of the anterior supraspinatus tendon with minimal interstitial
tearing. There is partial thickness articular sided insertional tearing at the footplate of the
supraspinatus tendon measuring up to approximately 7 millimeters AP dimension, though the tear is
much less than 50 percent and likely closer to 30 percent thickness. There is also interstitial tear
of the anterior supraspinatus myotendinous junction region. The infra spinatus and teres minor
tendons are intact. The subscapularis tendon shows no compelling evidence of tear.

Biceps tendon: The long head of the biceps tendon is well-seated in the intertubercular groove. No
significant intracapsular tendinopathy, though trace bright T2 signal is present which could reflect
minimal tendinopathy. The bicipital anchor is intact.

Labrum: Suboptimally characterized without intra-articular contrast and with motion artifact but
there is no overt evidence of detached labral tear.

Marrow/joint space: There is no fracture. Cystic change is noted at the anterior greater tuberosity
associated with rotator cuff pathology. The articular cartilage of the glenohumeral joint is grossly
intact without high-grade deficit. No aggressive osseous lesion.

IMPRESSION

Partial thickness articular sided tearing of the anterior insertional supraspinatus tendon with
background tendinopathy and interstitial tearing also noted. Mild acromioclavicular
osteoarthropathy.

Tech Notes:

right shoulder pain, no known injury

## 2021-07-22 IMAGING — CR [ID]
2 series · 2 of 2 positions shown · non-contrast
Comparison: none

PROCEDURE: 1VDSY
HISTORY: cough. denies preg. AK

[chest pa]
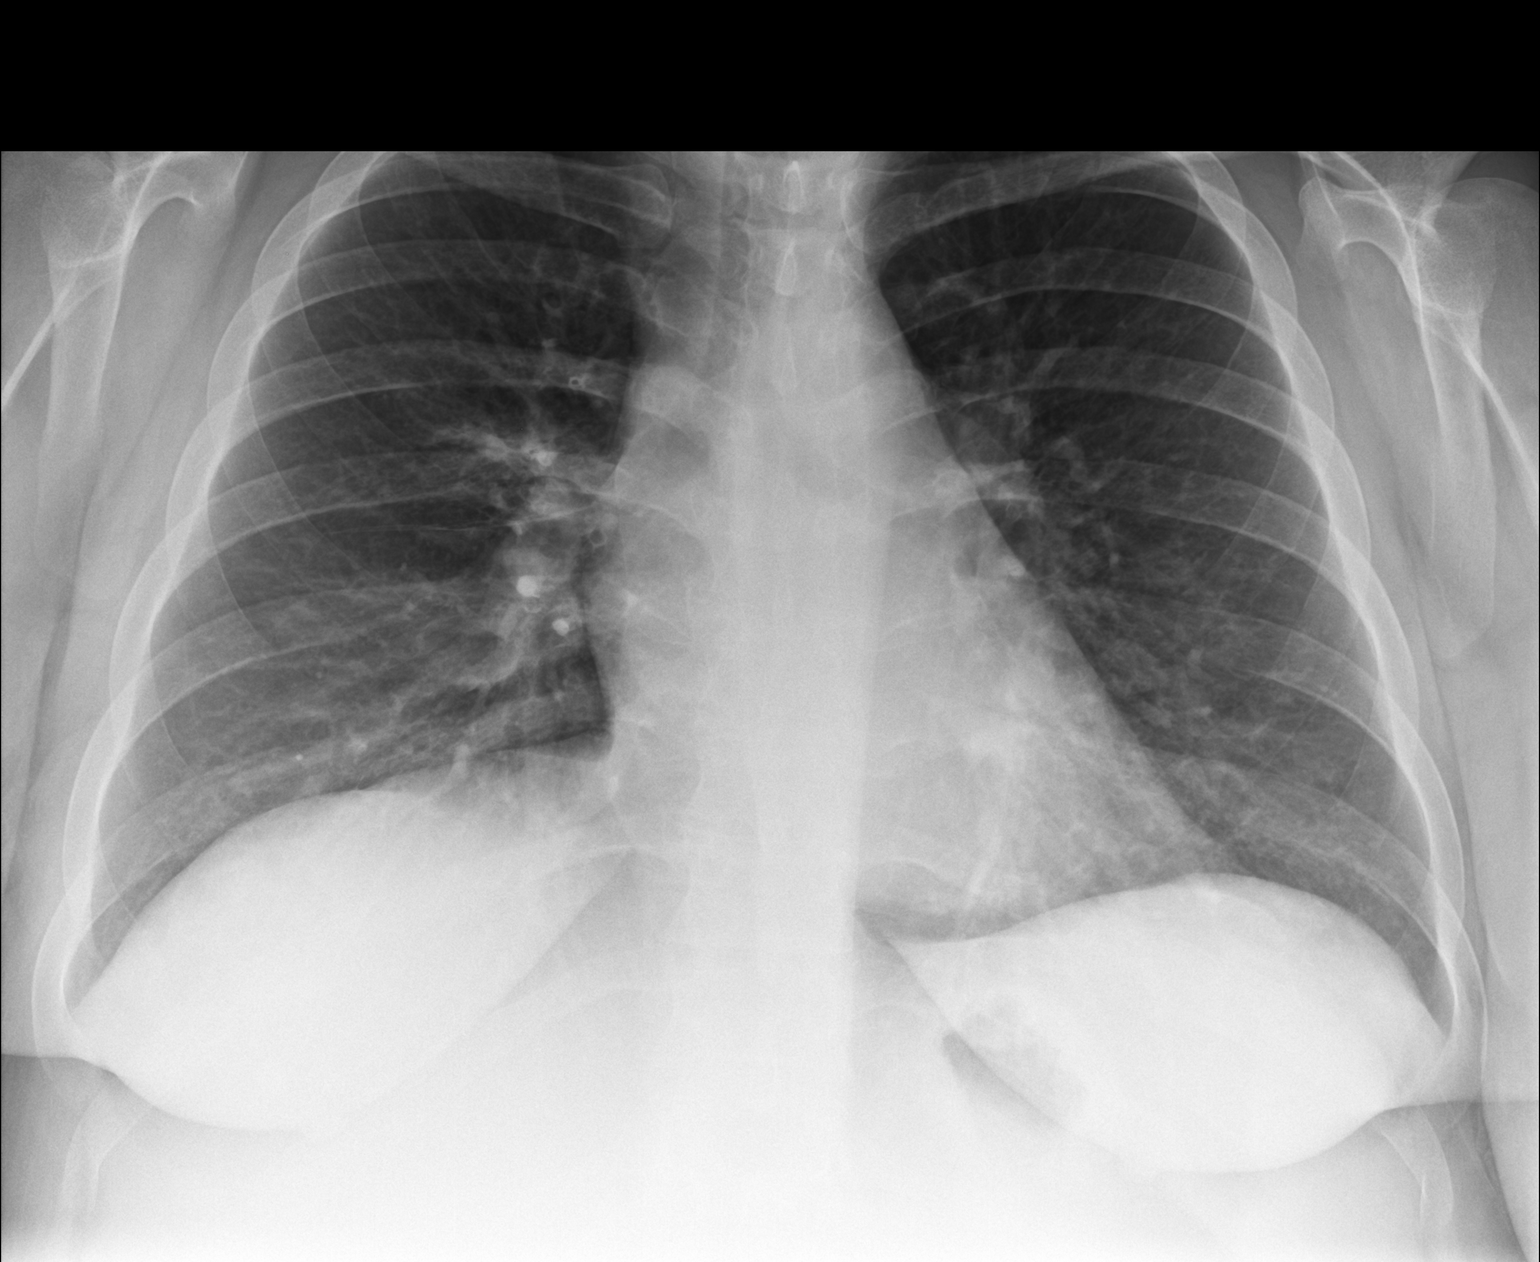

[chest lat]
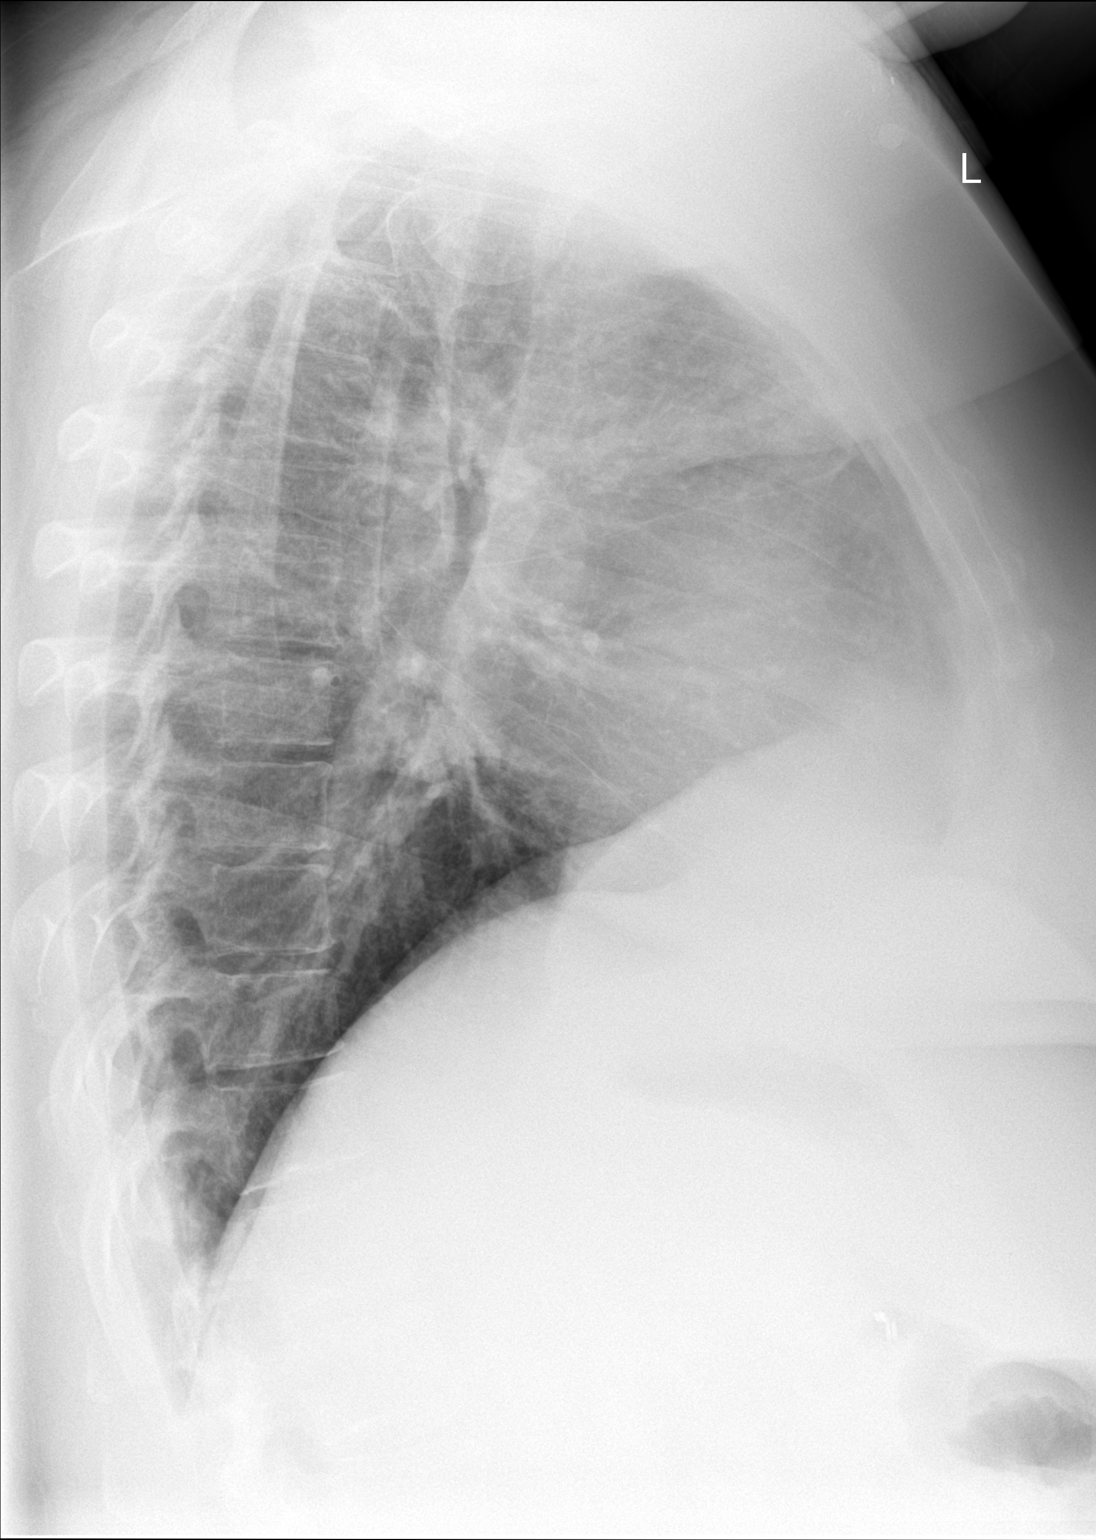

[2 of 2 positions shown; findings below may reference images not displayed]

FINDINGS: Two views of the chest without comparison examination shows the cardiac silhouette and
mediastinal structures are within normal limits. There is no pulmonary vascular congestion. No
pleural
effusion or pneumothorax is seen. Lung fields are clear without focal infiltrate or pulmonary
nodularity. The
visualized osseous structures are within normal limits.
IMPRESSION: No radiographically evident acute cardiopulmonary process.

Tech Notes:

READ BY MAES

## 2021-07-23 IMAGING — CT PE(Adult)
2 of 5 series · 12 of 36 positions shown · IV contrast (agent unspecified)
Comparison: none

PROCEDURE: PE(Adult)
HISTORY: pt c/o cough, soa. elevated d-dimer. 100ml FIIZFBT given via iv. denies preg. AK
TECHNIQUE: Axial Angiogrm CT imaging of the chest was performed with IV contrast. 2D
and 3-D coronal volume rendered MIPS images with reconstructions were made to better
evaluate pathology. No comparison available. This exam was performed using one or
more the following dose reduction techniques: Automated exposure control, adjustment of
the mA and/or KV according to the patient's size or use of iterative reconstruction
technique. Total DLP dose measures to 29 mGy with a total CTDI dose measuring 25
mGy.

[Series 8: cta pulmonary cor 2.00 bv36 s3 · coronal · 0.56mm/px · 3 of 180 slices shown]
[im 36/180  mediastinal]
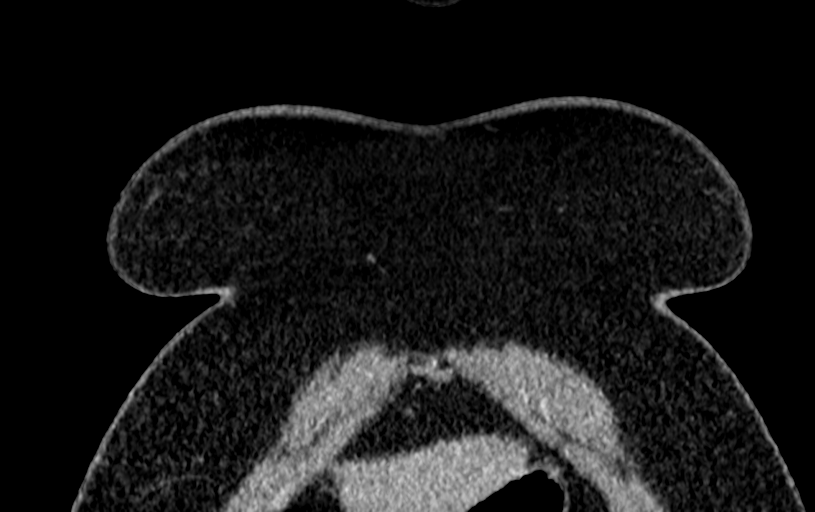
[im 72/180  mediastinal]
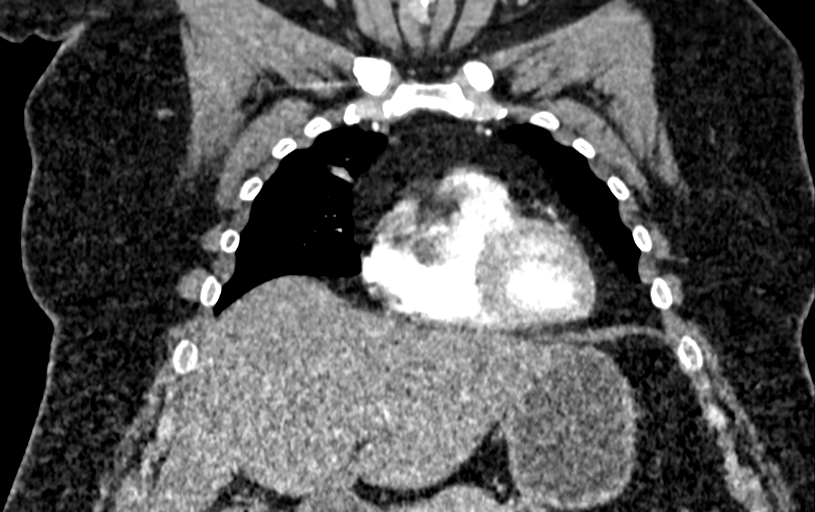
[im 108/180  mediastinal]
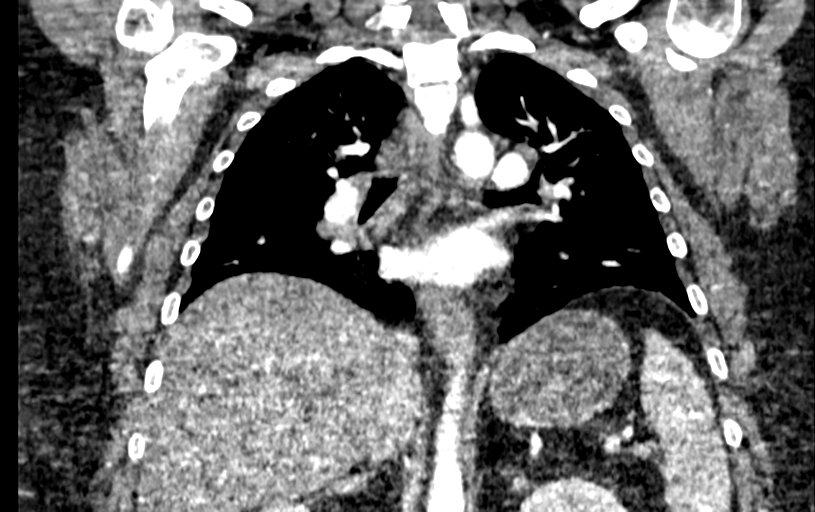

[Series 14: cta pulmonary ax 1.50 br60 s3 · axial · 0.71mm/px · z∈[+1772,+2000]mm · 9 of 190 slices shown]
[im 19/190  lung]
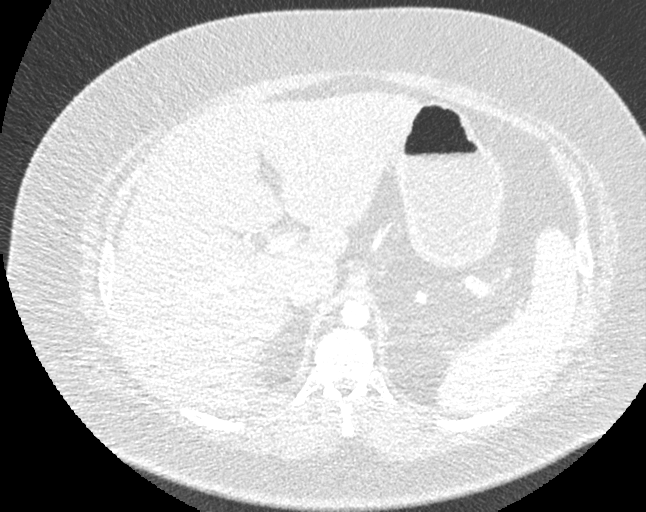
[im 38/190  mediastinal]
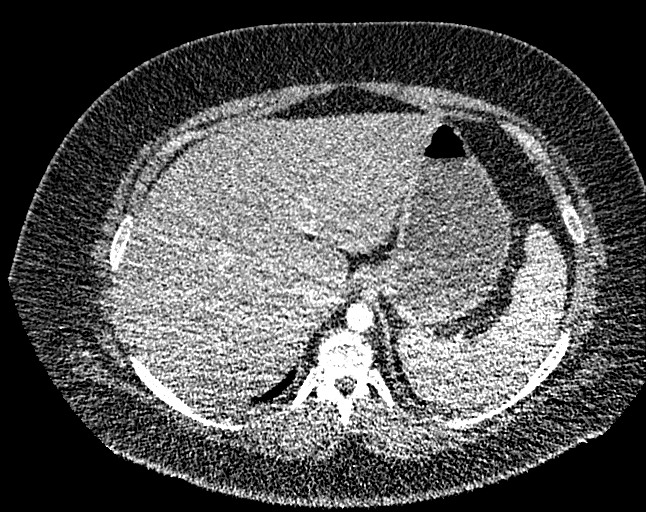
[im 57/190  lung]
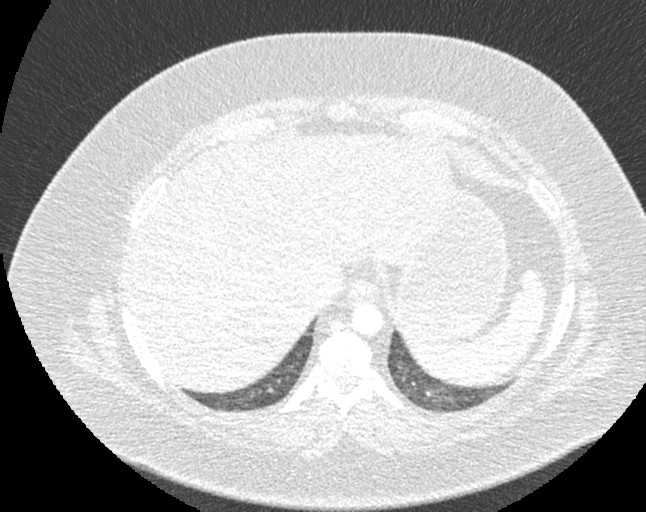
[im 76/190  mediastinal]
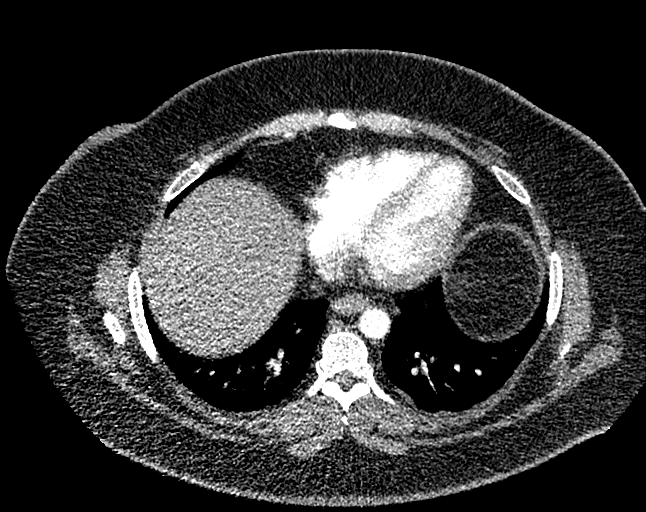
[im 95/190  lung]
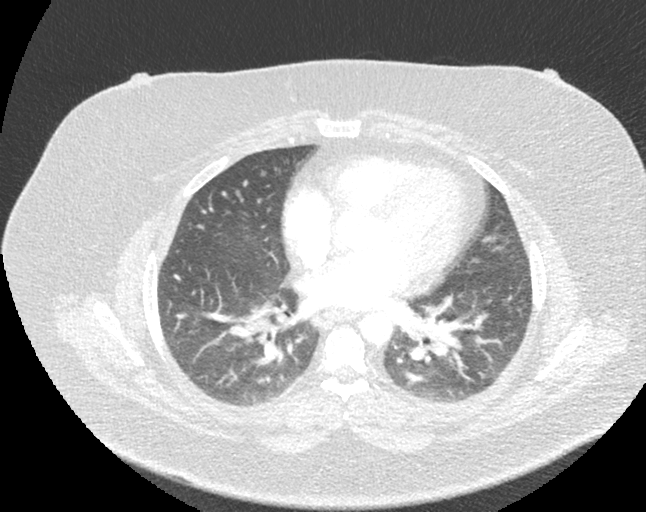
[im 114/190  mediastinal]
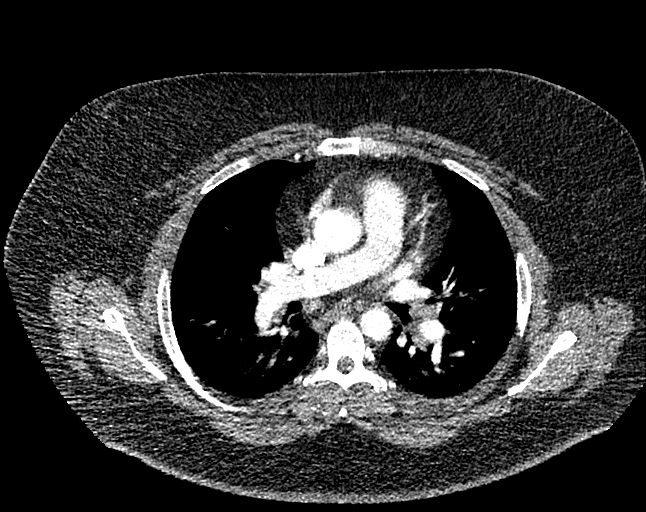
[im 133/190  lung]
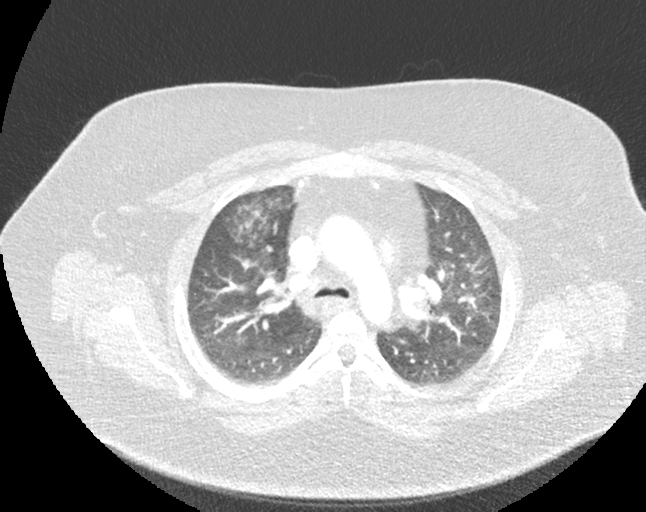
[im 152/190  mediastinal]
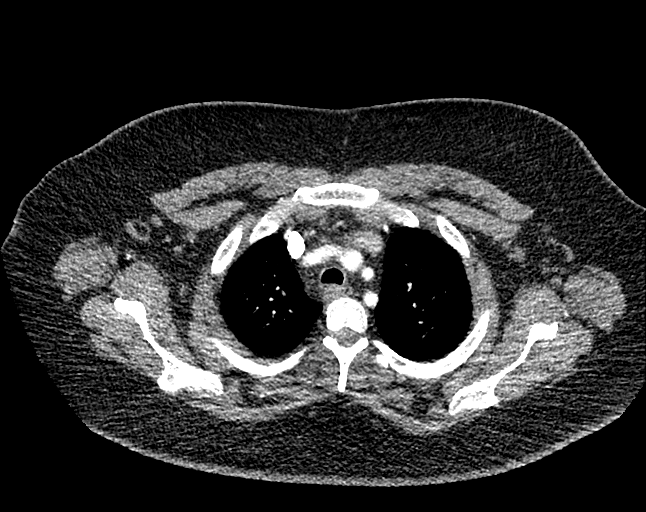
[im 171/190  lung]
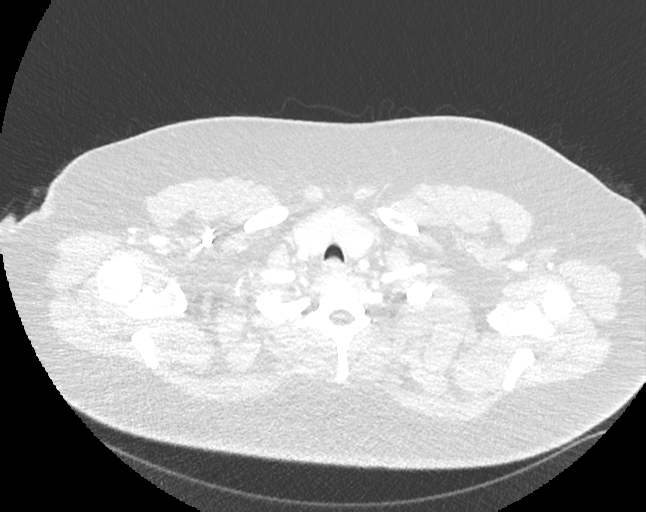

[12 of 36 positions shown; findings below may reference images not displayed]

FINDINGS: There is mild subsegmental pulmonary embolism in the right lower lobe. The
aorta to pulmonary artery ratio is greater than 1. The ventricular septum is midline in
position. There is no pericardial effusion seen. I don't see any mediastinal adenopathy.
Cardiac size is within normal limits. There is mild atherosclerotic calcification seen of the
large vessels of the mediastinum. There is no pleural effusion or pneumothorax. Bilateral
peribronchial thickening is seen. The lungs are clear without focal infiltrate or
consolidation. No displaced rib fractures are seen. There are mild degenerative changes
seen in the mid thoracic spine. Limited visualization of the upper abdomen is
unremarkable.
IMPRESSION: 1. There is mild subsegmental pulmonary embolism in the right lower lobe.
2. Bilateral peribronchial thickening is seen reflecting bronchitis without focal
consolidation.

THE CRITICAL RESULTS OF THIS EXAM WERE DISCUSSED WITH HURRY HAKOKA AT

Tech Notes:

READ BY MAES

## 2021-07-25 ENCOUNTER — Encounter: Admit: 2021-07-25 | Discharge: 2021-07-25 | Payer: Commercial Managed Care - PPO

## 2021-07-25 ENCOUNTER — Ambulatory Visit: Admit: 2021-07-25 | Discharge: 2021-07-25 | Payer: Commercial Managed Care - PPO

## 2021-07-25 DIAGNOSIS — M25511 Pain in right shoulder: Secondary | ICD-10-CM

## 2021-07-25 DIAGNOSIS — I2699 Other pulmonary embolism without acute cor pulmonale: Secondary | ICD-10-CM

## 2021-07-25 IMAGING — CR RIBSLT
3 series · 3 of 3 positions shown · non-contrast
Comparison: none

[ribs ap upper]
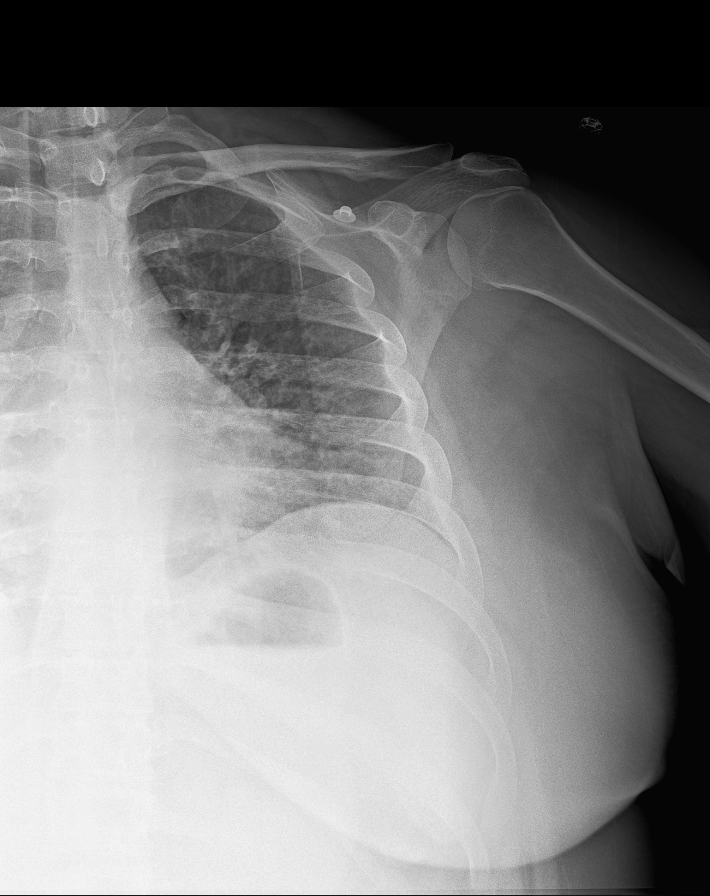

[ribs obl]
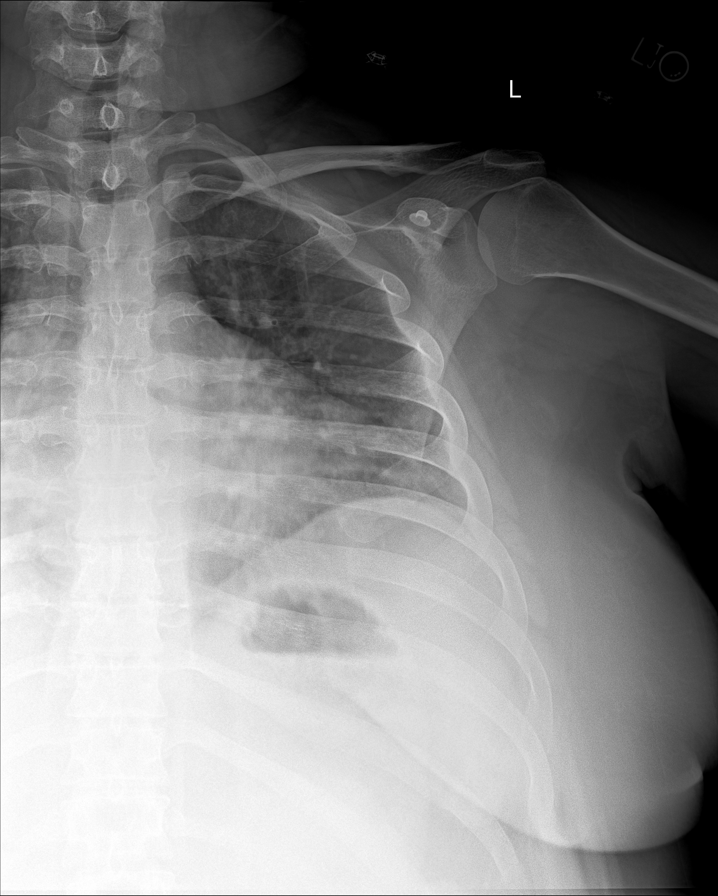

[ribs ap lower]
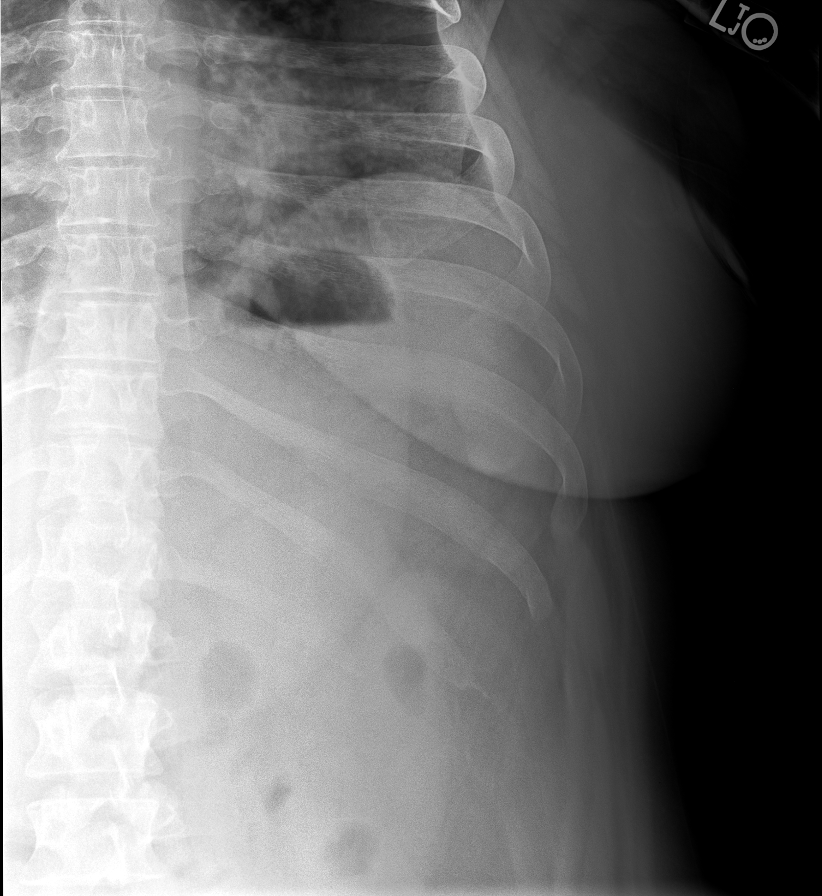

[3 of 3 positions shown; findings below may reference images not displayed]

DIAGNOSTIC STUDIES

EXAM

XR ribs, left

INDICATION

lower rib pain
coughing and heard a "pop" last night. pain in lower rib region

TECHNIQUE

Detailed views left ribs were obtained.

COMPARISONS

None available

FINDINGS

No fractures are seen. No osseous abnormalities are evident.

IMPRESSION

No rib fractures are seen.

Tech Notes:

coughing and heard a "pop" last night. pain in lower rib region

## 2021-07-25 IMAGING — US ECHOCOMPL
1 of 2 series · 12 of 24 positions shown · non-contrast
Comparison: none

[Series 1: us echo 2d, complete · 95 acquisitions, 12 frames shown]
[im 1/95]
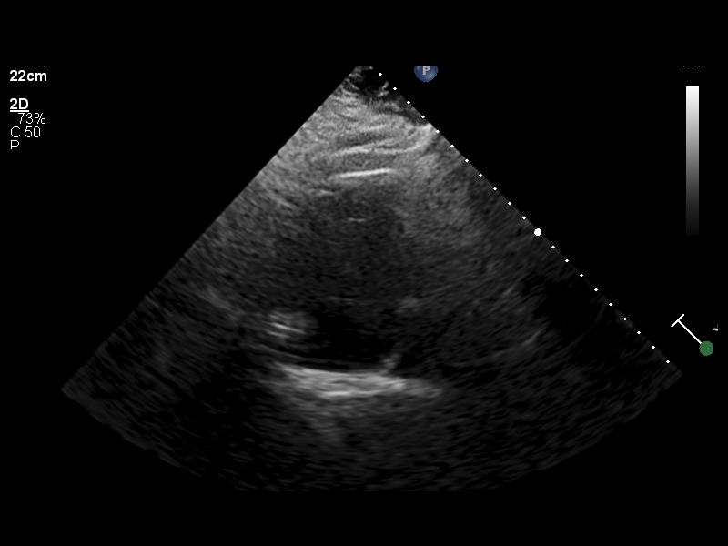
[im 13/95]
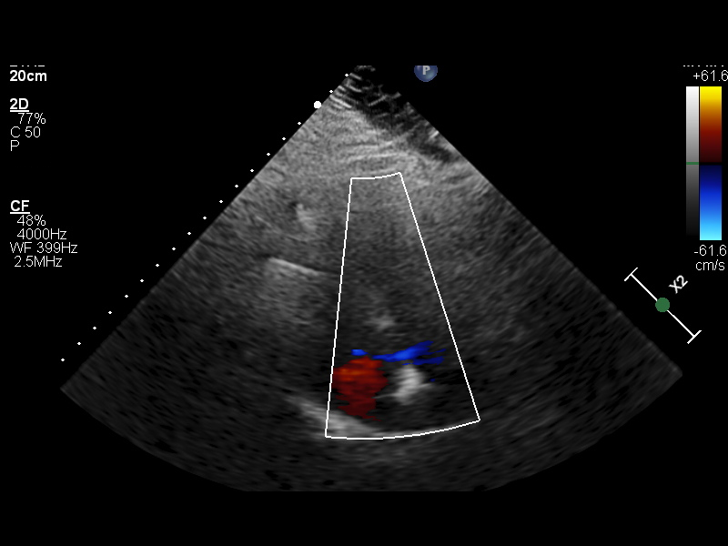
[im 18/95]
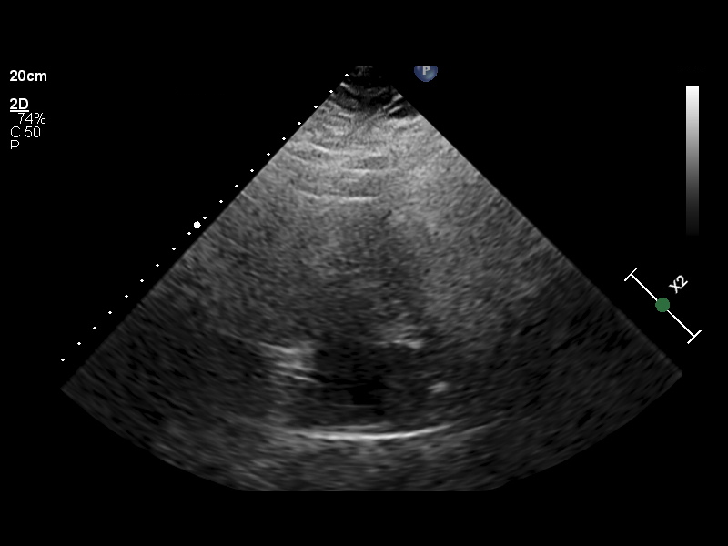
[im 26/95]
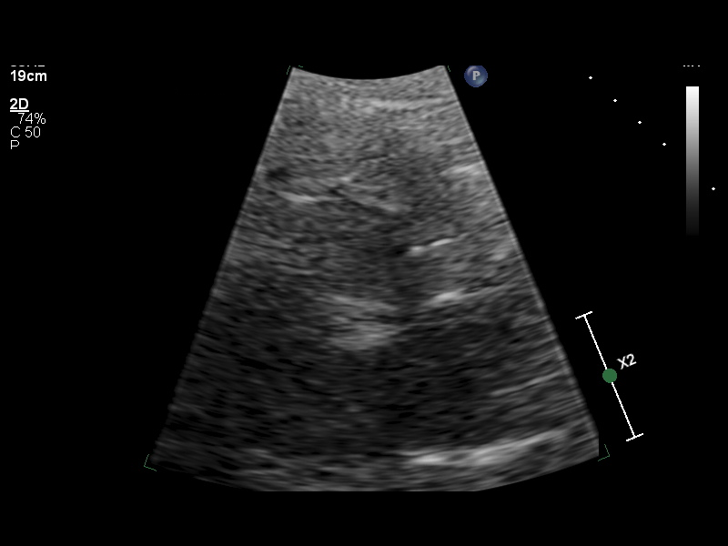
[im 35/95]
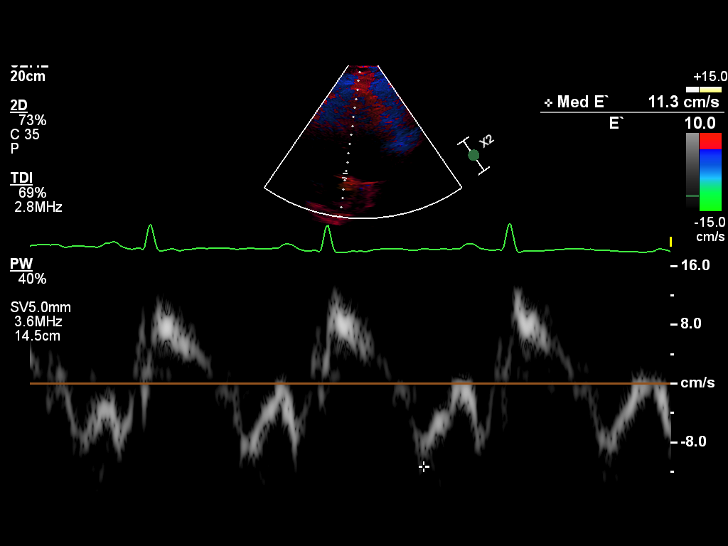
[im 43/95]
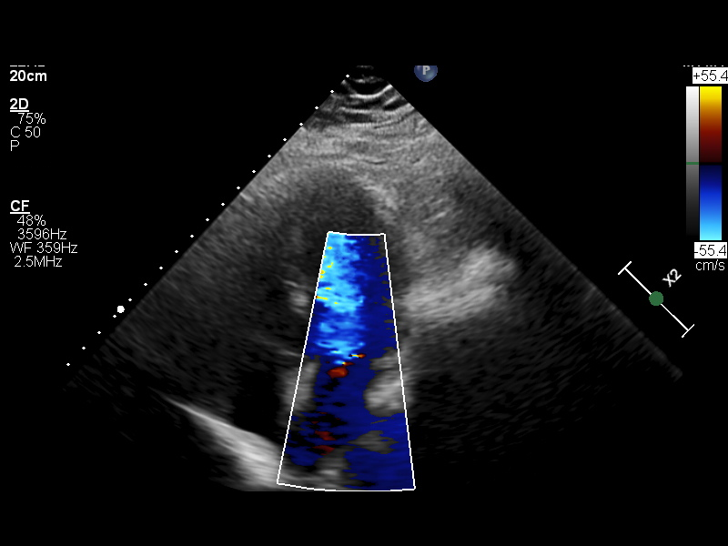
[im 52/95]
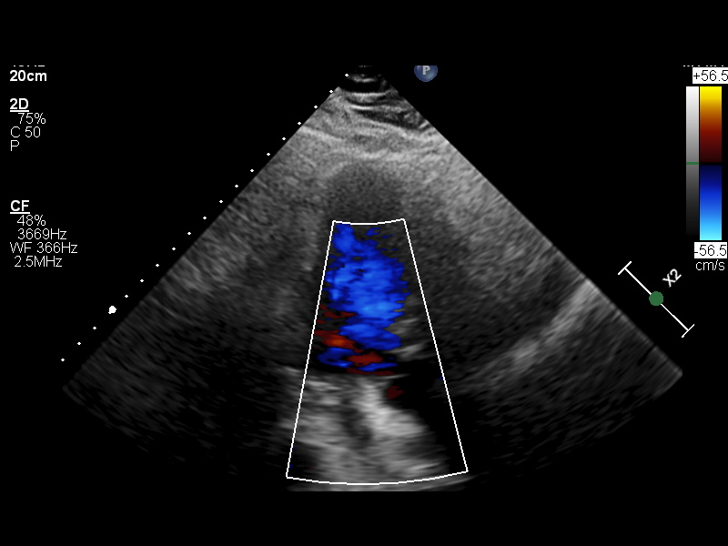
[im 60/95]
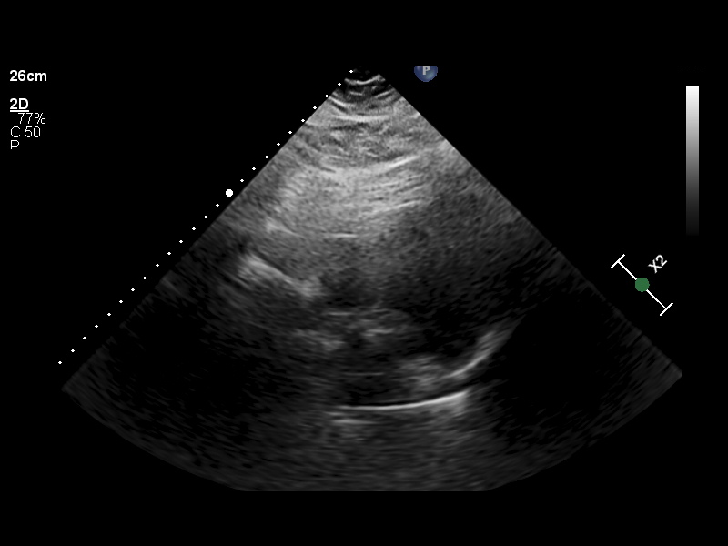
[im 69/95]
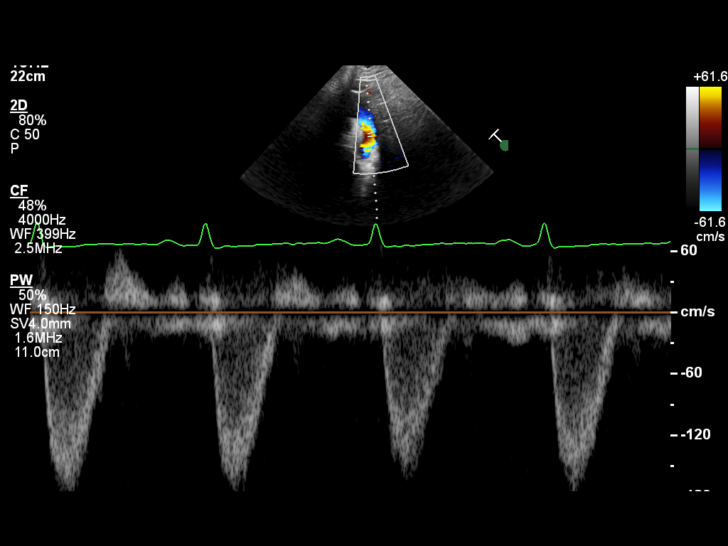
[im 77/95]
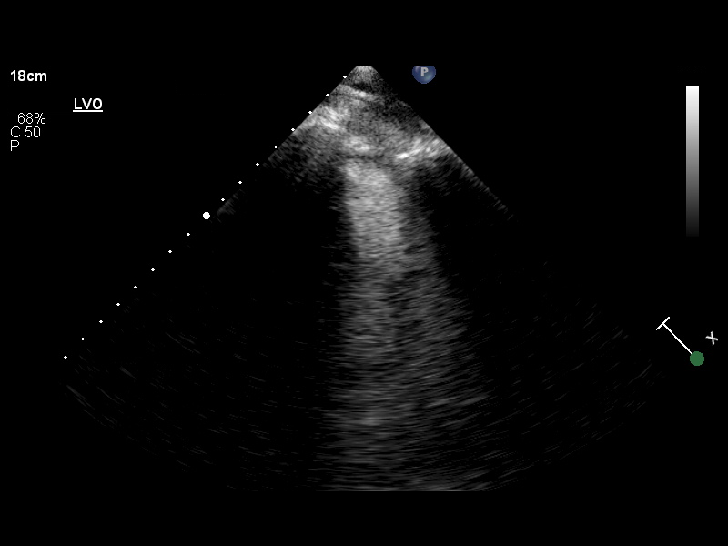
[im 86/95]
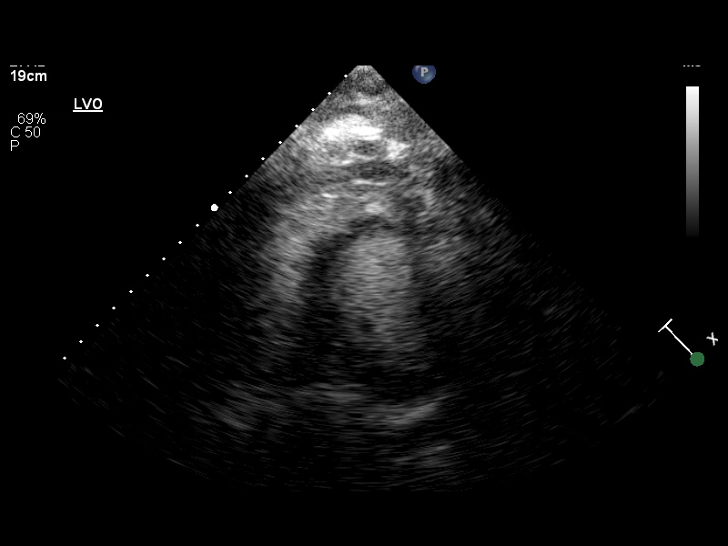
[im 95/95]
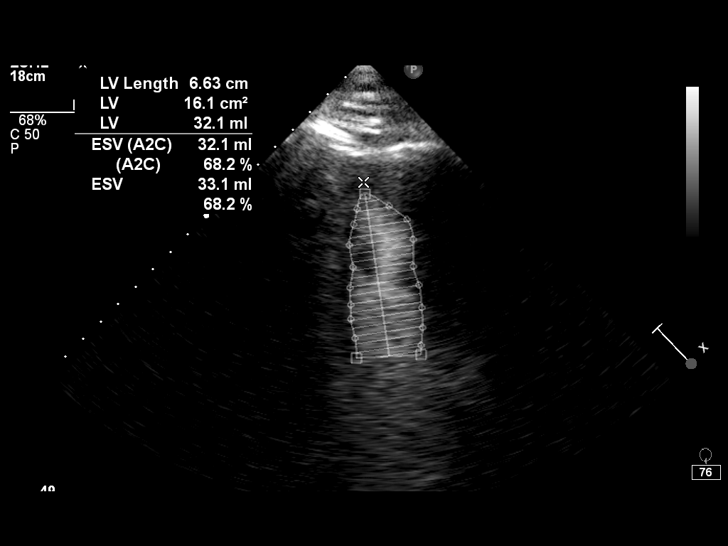

[12 of 24 positions shown; findings below may reference images not displayed]

07/25/21 -  2D + DOPPLER ECHO
Location Performed: [HOSPITAL]

Referring Provider:
Fellow:
Location of Interp:
Sonographer: External Staff
Machine:  [employer]

Indications:           Pulmonary Embolism

Vitals
Height   Weight   BSA (Calculated)   BP   Comments
170.2 cm (5' 7")   126 kg (277 lb 12.5 oz)   2.44   149/100

Interpretation Summary
Technically difficult study. Echo contrast was used to improve endocardial border delineation.
The left ventricular size is normal. Wall thickness is increased. Concentric remodeling. The left
ventricular systolic function is hyperdynamic. The visually estimated ejection fraction is 75%.
There are no segmental wall motion abnormalities.
The right ventricular size is normal. The right ventricular systolic function is normal.
Normal biatrial size.
Valves are poorly visualized. There is no Doppler evidence of hemodynamically significant valvular
disease.
The pulmonary artery pressure could not be estimated due to inadequate tricuspid regurgitation
signal.
No pericardial effusion.
No prior for comparison.

Echocardiographic Findings
Left Ventricle   The left ventricular size is normal. Wall thickness is increased. Concentric
remodeling. The left ventricular systolic function is hyperdynamic. The visually estimated ejection
fraction is 75%. There are no segmental wall motion abnormalities. Normal left ventricular diastolic
function. Normal left atrial pressure.
Right Ventricle   The right ventricular size is normal. The right ventricular systolic function is
normal. The pulmonary artery pressure could not be estimated due to inadequate tricuspid
regurgitation signal.
Left Atrium   Normal size.
Right Atrium   Normal size.
IVC/SVC   Elevated central venous pressure (5-10 mm Hg).
Mitral Valve   The mitral valve was not well seen. No stenosis. No regurgitation.
Tricuspid Valve   The tricuspid valve was not well seen. No stenosis. No regurgitation.
Aortic Valve   The aortic valve was not well seen. No stenosis. No regurgitation.
Pulmonary   The pulmonic valve was not seen well but no Doppler evidence of stenosis. No
regurgitation.
Aorta   The aortic root and ascending aorta are normal in size.
Pericardium   No pericardial effusion.

Left Ventricular Wall Scoring
Resting   Score Index: 1.000   Percent Normal: 100.0%

The left ventricular wall motion is normal.

Left Heart 2D Measurements (Normal Ranges)
EF (Visual)
75 %
LVIDD
3.9 cm  (Range: 3.8 - 5.2)
LVIDS
2.5 cm  (Range: 2.2 - 3.5)
IVS
1.2 cm  (Range: 0.6 - 0.9)
LV PW
1.6 cm  (Range: 0.6 - 0.9)
LA Size
3.6 cm  (Range: 2.7 - 3.8)

Right Heart 2D   M-Mode Measurements (Normal Ranges) (Range)
RV Basal Dia
3.0 cm  (2.5 - 4.1)
RV Mid Dia
1.7 cm  (1.9 - 3.5)
JORDY
14.1 cm2  (<18)
M-Mode TAPSE
2.1 cm  (>1.7)

Left Heart 2D Addnl Measurements (Normal Ranges)
LV Systolic Vol
33 mL  (Range: 14 - 42)
LV Systolic Vol Index
14 mL/m2  (Range: 8 - 24)
LV Diastolic Vol
104 mL  (Range: 46 - 106)
LV Diastolic Vol Index
43 mL/m2  (Range: 29 - 61)
LA Vol
44 mL  (Range: 22 - 52)
LA Vol Index
18.03 mL/m2  (Range: 16 - 34)
LV Mass
201 g  (Range: 67 - 162)
LV Mass Index
83 g/m2  (Range: 43 - 95)
RWT
0.82  (Range: <=0.42)

Aortic Root Measurements (Normal Ranges)
Sinus
3.3 cm  (Range: 2.4 - 3.6)
AO Prox
3.1 cm  (Range: 1.9 - 3.5)

Tech Notes:

Definity RT 8318. Reviewed allergies. JL

## 2021-07-25 IMAGING — CR SPLUMBLM
3 series · 3 of 3 positions shown · non-contrast
Comparison: none

[lspine ap]
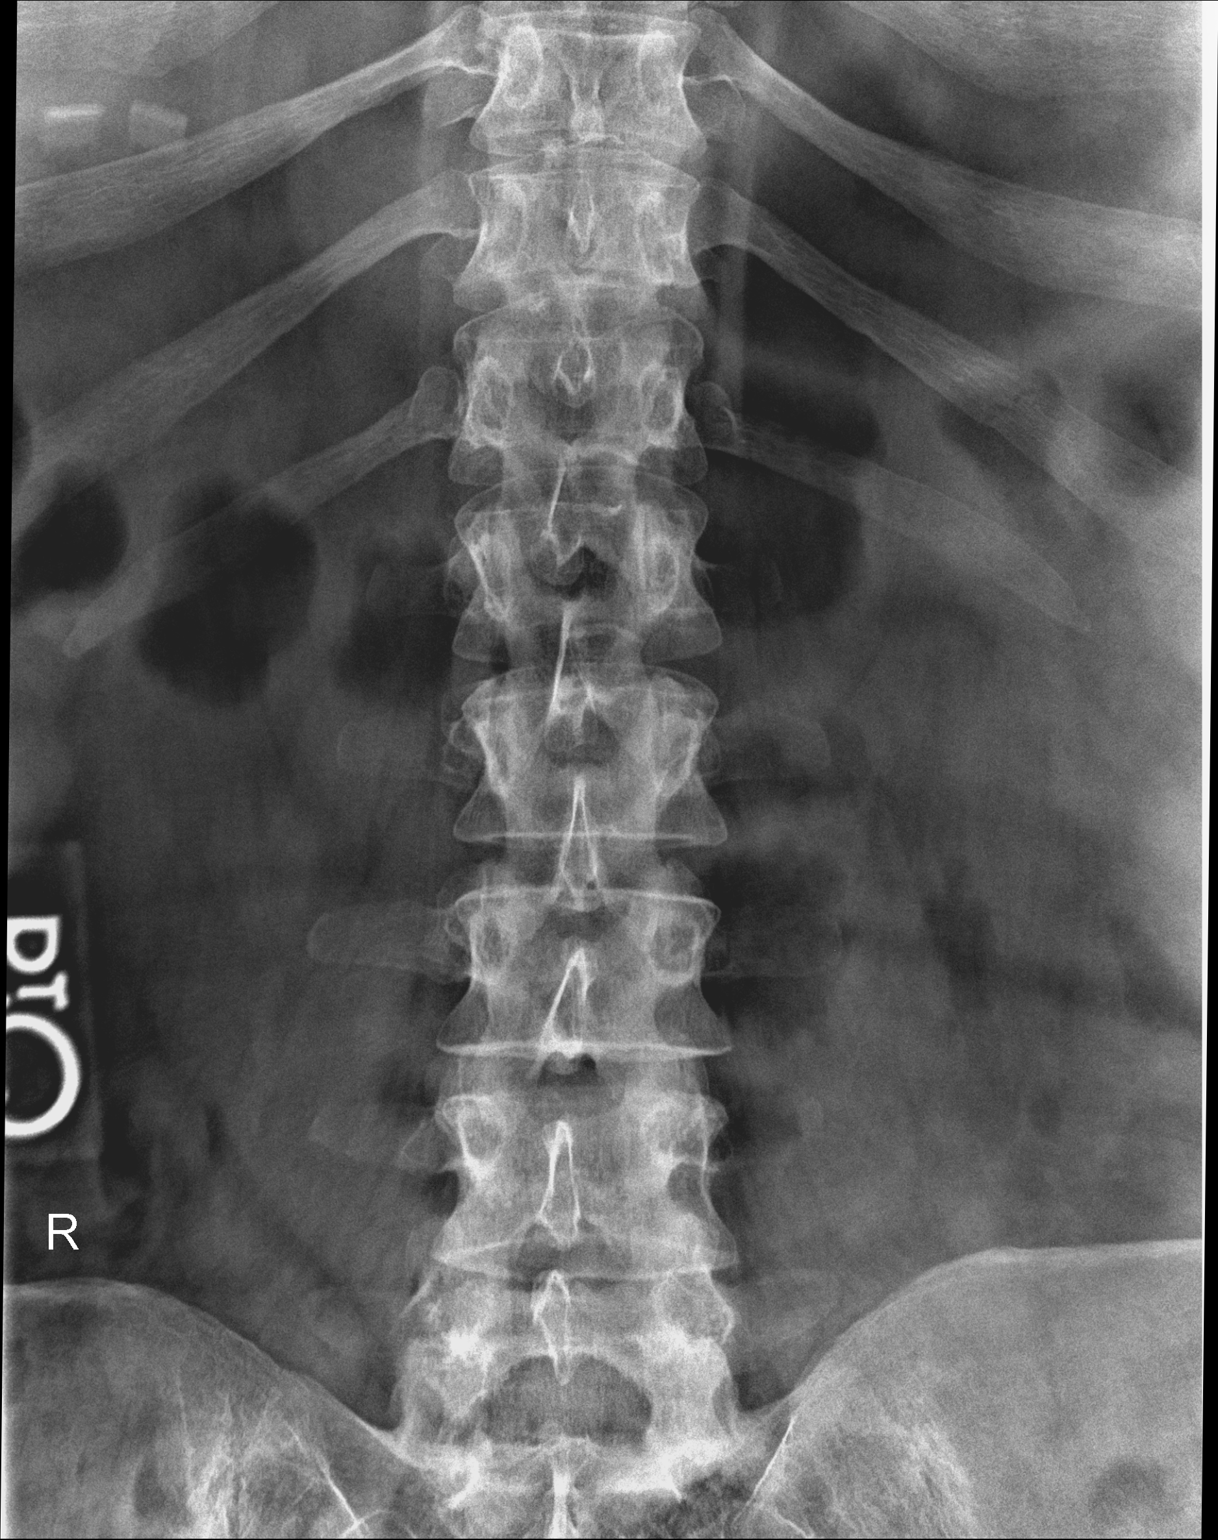

[lspine lat]
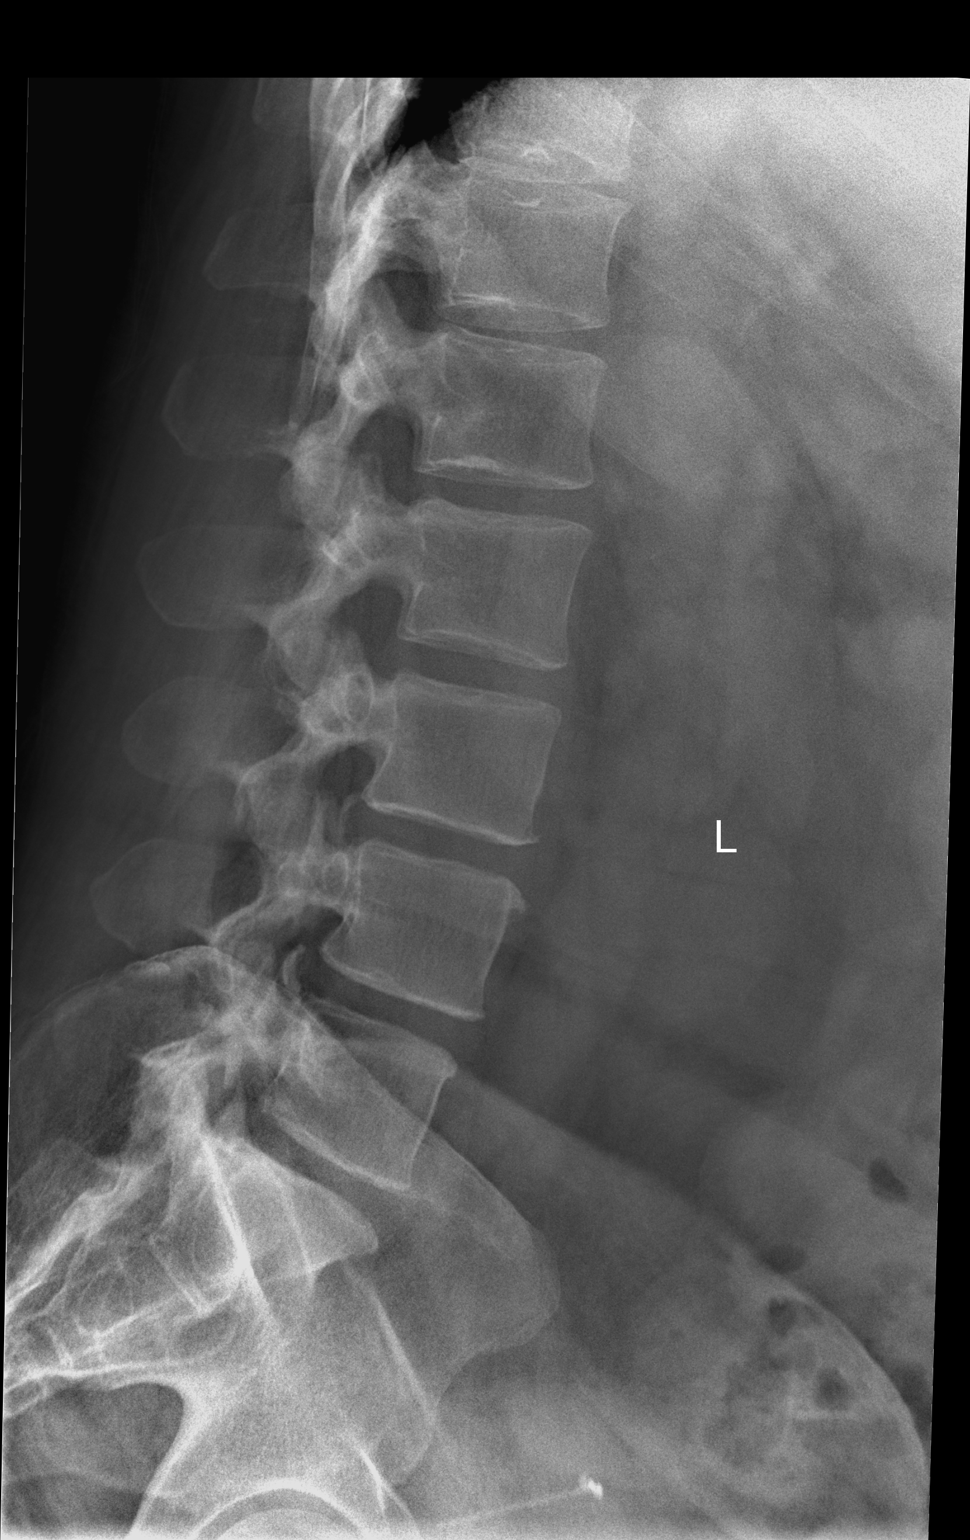

[lspine l5-s1]
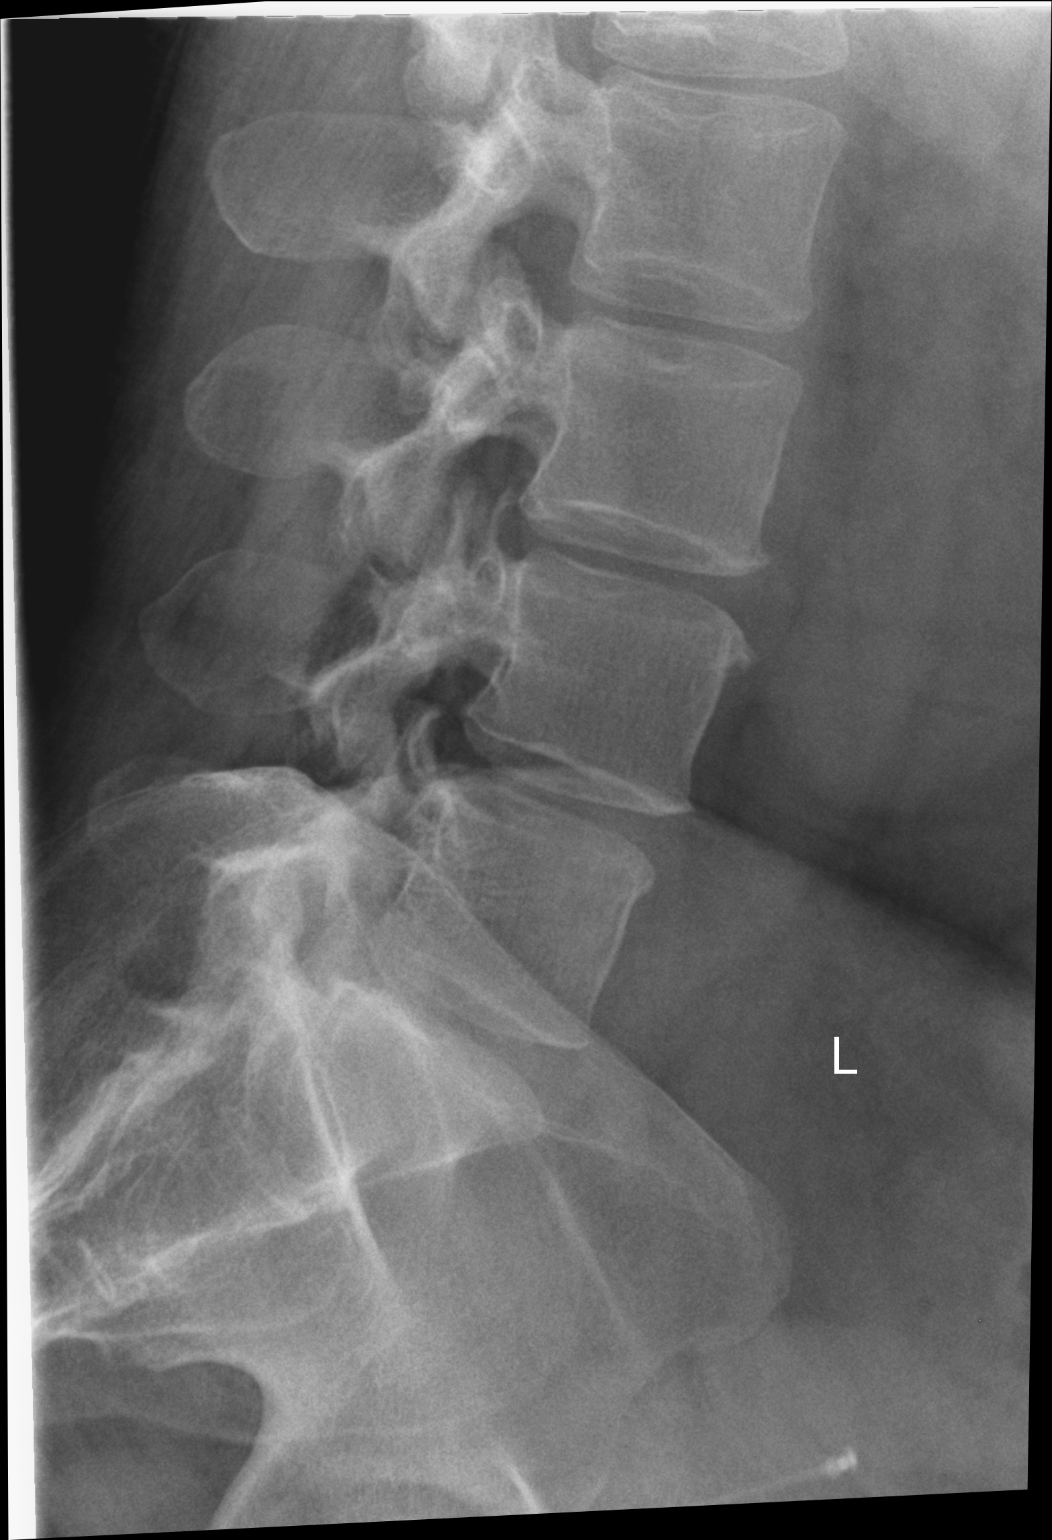

[3 of 3 positions shown; findings below may reference images not displayed]

DIAGNOSTIC STUDIES

EXAM

XR lumbar spine 2-3V

INDICATION

left back pain
coughing and heard a "pop" last night. pain in lower rib region

TECHNIQUE

AP lateral and spot films

COMPARISONS

None available

FINDINGS

There is normal height and alignment lumbar vertebral bodies. Slight disc space narrowing with
anterior osteophytic spurring is seen at L3-4. No compression fractures are seen. Remaining disc
spaces are well preserved.

IMPRESSION

Minor degenerative changes at L3-4.

Tech Notes:

coughing and heard a "pop" last night. pain in lower rib region

## 2021-07-27 IMAGING — CT PE(Adult)
2 of 5 series · 12 of 36 positions shown · IV contrast (omnipaque)
Comparison: None
COMPARISON: 07/23/2021

PROCEDURE: PE(Adult)
HISTORY: Known PE, increasing shortness of breath, missed 2 doses of Eliquis. Creat: 0.83 GFR: 80.9.
Given 244mls omnipaque 350 via iv. CT/NM 1/0. TB
TECHNIQUE: Axial CTA of the chest was obtained after the uneventful administration of intravenous
contrast
timed in pulmonary arterial phase. Subsequently, coronal and sagittal reconstructions were obtained
at a
separate workstation. This exam was performed using one or more the following dose reduction
techniques:
Automated exposure control, adjustment of the mA and/or KV according to the patient's size or use of
iterative reconstruction technique.

The contrast density in the main pulmonary artery measures 377 Hounsfield units, which is adequate
for
evaluation.

[Series 7: cta pulmonary cor 2.00 bv36 s3 · coronal · 0.63mm/px · 3 of 196 slices shown]
[im 40/196  mediastinal]
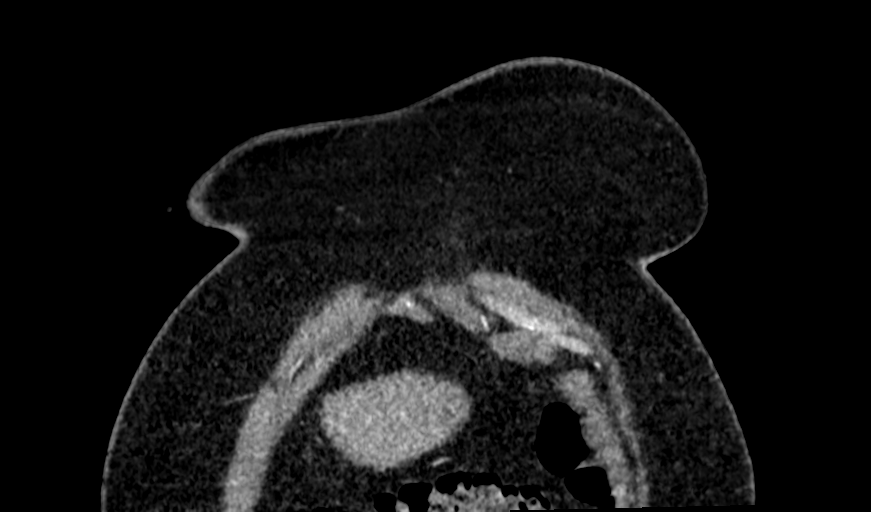
[im 79/196  mediastinal]
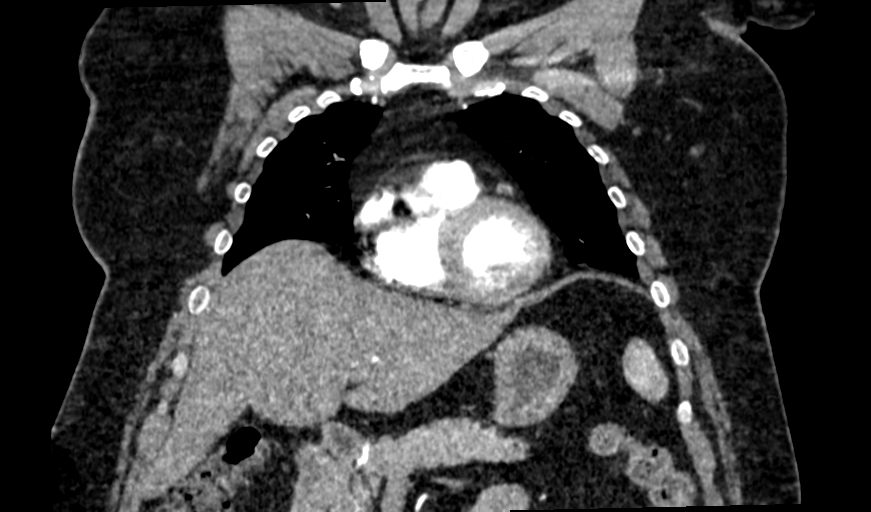
[im 118/196  mediastinal]
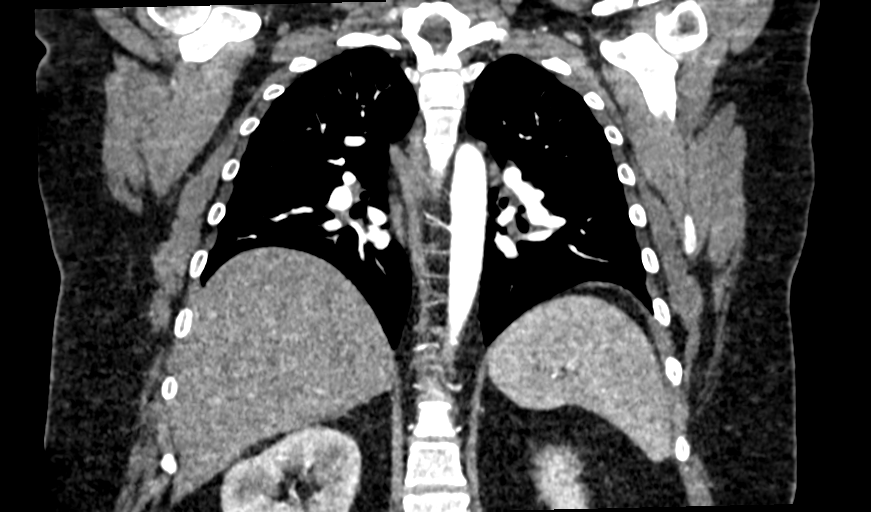

[Series 13: cta pulmonary ax 1.50 br60 s3 · axial · 0.78mm/px · z∈[+1386,+1643]mm · 9 of 211 slices shown]
[im 22/211  lung]
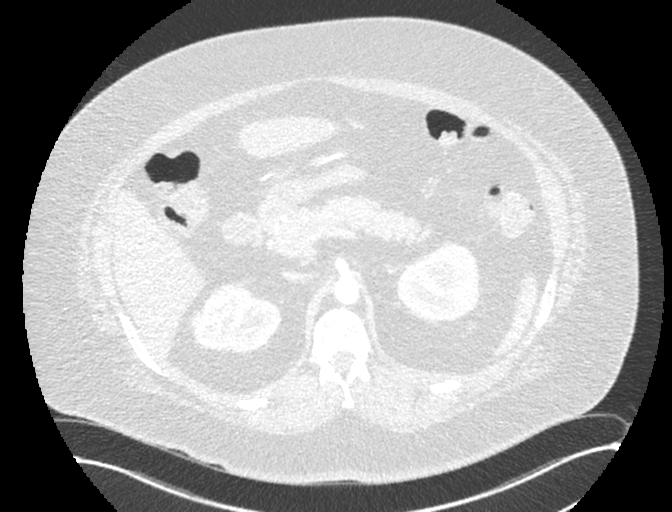
[im 43/211  mediastinal]
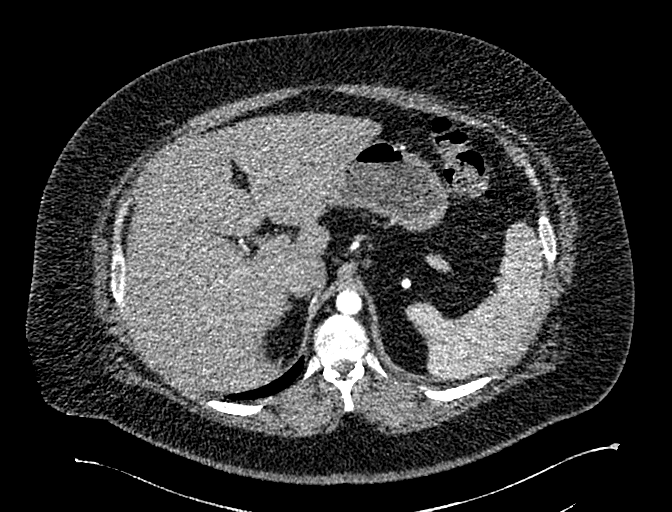
[im 64/211  lung]
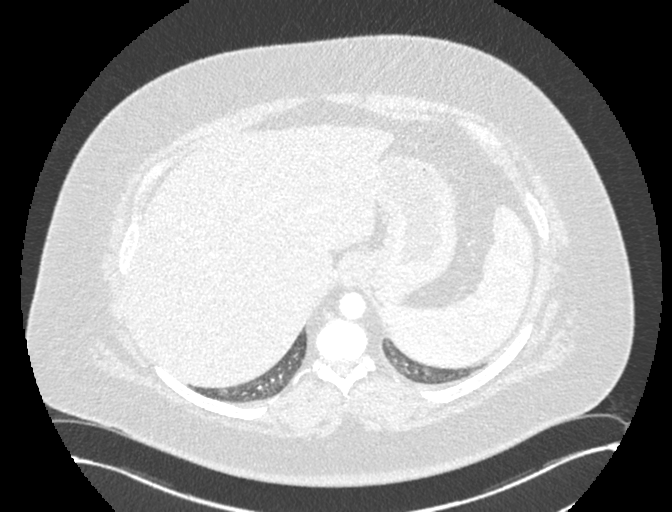
[im 85/211  mediastinal]
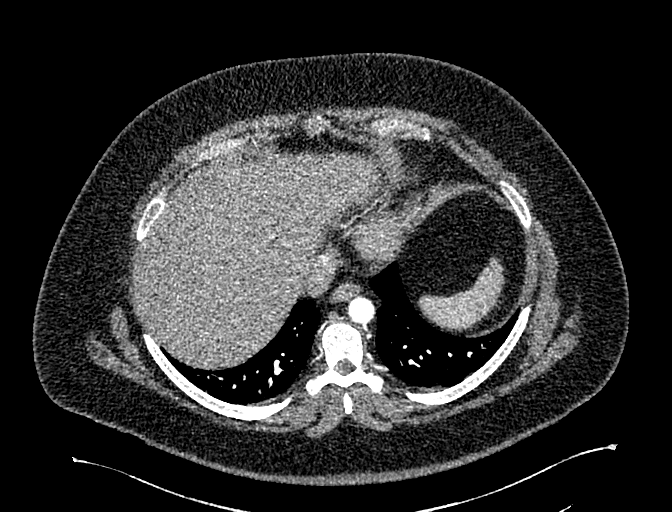
[im 106/211  lung]
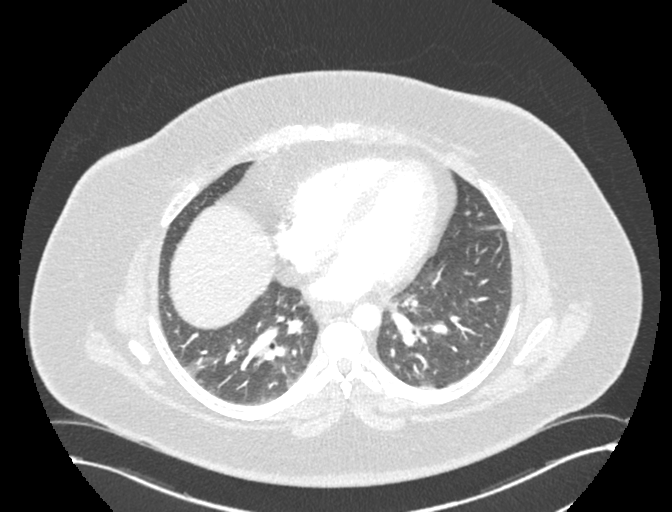
[im 127/211  mediastinal]
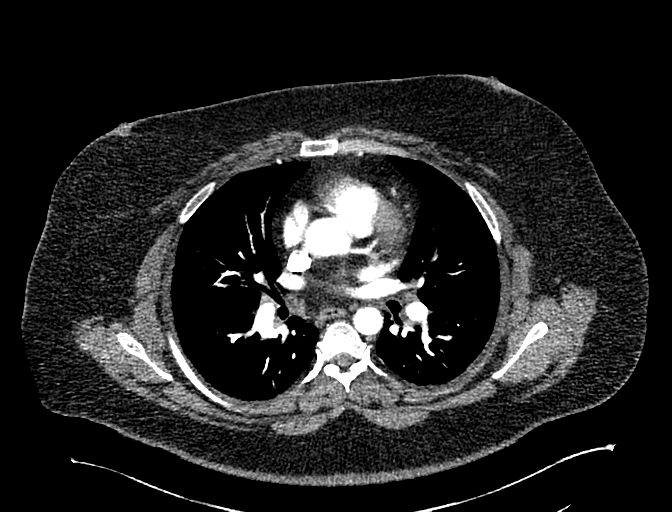
[im 148/211  lung]
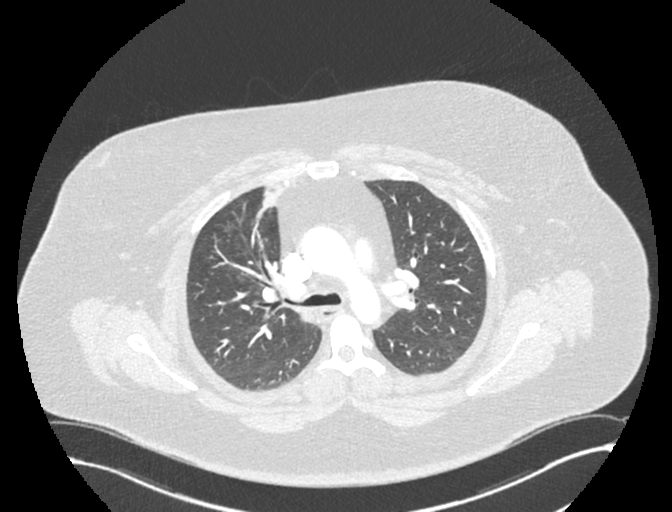
[im 169/211  mediastinal]
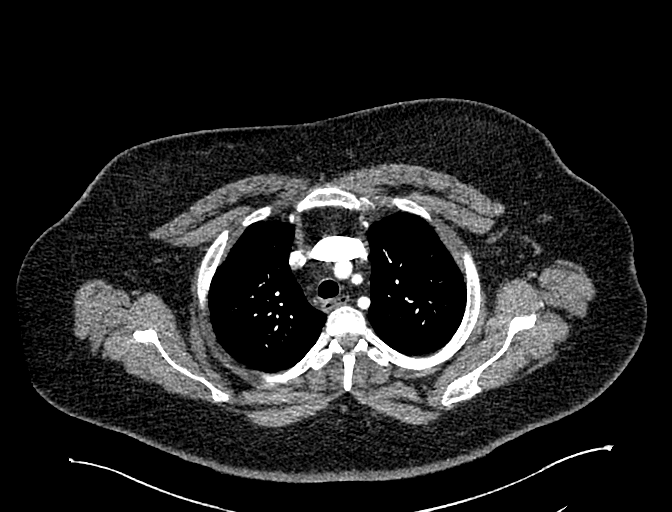
[im 190/211  lung]
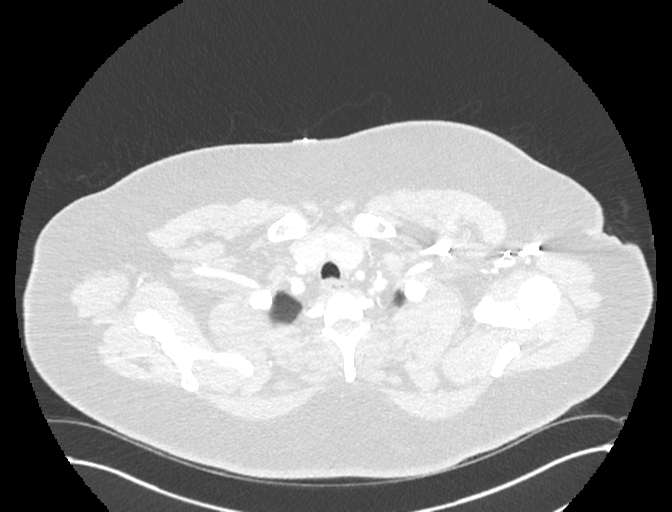

[12 of 36 positions shown; findings below may reference images not displayed]

FINDINGS: Chest
The heart is unremarkable allowing for nongating.
There are no filling defects in the pulmonary arteries.
The lungs are clear without consolidation or mass.
No pleural or pericardial effusions are demonstrated.

Imaged abdomen
No acute findings in the imaged abdomen.

Review of the remaining osseous and soft tissue structures demonstrates no acute findings.
IMPRESSION: 1. No evidence of pulmonary embolism.
Tech Notes:

Known PE, increasing shortness of breath, missed 2 doses of Eliquis. Creat: 0.83 GFR: 80.9. Given
244mls omnipaque 350 via iv. CT/NM 1/0. TB

## 2021-08-07 ENCOUNTER — Encounter: Admit: 2021-08-07 | Discharge: 2021-08-07 | Payer: Commercial Managed Care - PPO

## 2021-08-12 ENCOUNTER — Encounter: Admit: 2021-08-12 | Discharge: 2021-08-12 | Payer: Commercial Managed Care - PPO

## 2021-08-12 ENCOUNTER — Ambulatory Visit: Admit: 2021-08-12 | Discharge: 2021-08-12 | Payer: Commercial Managed Care - PPO

## 2021-08-12 DIAGNOSIS — M7551 Bursitis of right shoulder: Secondary | ICD-10-CM

## 2021-08-12 DIAGNOSIS — M25511 Pain in right shoulder: Secondary | ICD-10-CM

## 2021-08-12 NOTE — Patient Instructions
It was a pleasure seeing you in clinic today. Please contact our office if you have any questions or concerns.    Thank You,  Suriya RN-BSN  Clinical Nurse for Dr. Tyler Fox and Randall Stroup PA-C  [facility]  [address]  [place], [place] [address]  P: [phone]  F: [phone]

## 2023-04-03 ENCOUNTER — Encounter: Admit: 2023-04-03 | Discharge: 2023-04-03 | Payer: Commercial Managed Care - PPO
